# Patient Record
Sex: Male | Born: 1935 | Race: Black or African American | Hispanic: No | Marital: Married | Smoking: Never smoker
Health system: Southern US, Community
[De-identification: ages and names within clinical notes are randomized; demographics above are authoritative.]

## PROBLEM LIST (undated history)

## (undated) DIAGNOSIS — K5730 Diverticulosis of large intestine without perforation or abscess without bleeding: Secondary | ICD-10-CM

## (undated) DIAGNOSIS — I10 Essential (primary) hypertension: Secondary | ICD-10-CM

## (undated) DIAGNOSIS — D649 Anemia, unspecified: Secondary | ICD-10-CM

## (undated) DIAGNOSIS — N400 Enlarged prostate without lower urinary tract symptoms: Secondary | ICD-10-CM

## (undated) DIAGNOSIS — E785 Hyperlipidemia, unspecified: Secondary | ICD-10-CM

## (undated) DIAGNOSIS — D721 Eosinophilia: Secondary | ICD-10-CM

## (undated) DIAGNOSIS — H409 Unspecified glaucoma: Secondary | ICD-10-CM

## (undated) DIAGNOSIS — E119 Type 2 diabetes mellitus without complications: Secondary | ICD-10-CM

## (undated) DIAGNOSIS — N259 Disorder resulting from impaired renal tubular function, unspecified: Secondary | ICD-10-CM

## (undated) HISTORY — DX: Anemia, unspecified: D64.9

## (undated) HISTORY — DX: Hyperlipidemia, unspecified: E78.5

## (undated) HISTORY — DX: Diverticulosis of large intestine without perforation or abscess without bleeding: K57.30

## (undated) HISTORY — DX: Essential (primary) hypertension: I10

## (undated) HISTORY — DX: Benign prostatic hyperplasia without lower urinary tract symptoms: N40.0

## (undated) HISTORY — DX: Disorder resulting from impaired renal tubular function, unspecified: N25.9

## (undated) HISTORY — DX: Eosinophilia: D72.1

## (undated) HISTORY — DX: Unspecified glaucoma: H40.9

## (undated) HISTORY — DX: Type 2 diabetes mellitus without complications: E11.9

---

## 2001-07-26 ENCOUNTER — Emergency Department (HOSPITAL_COMMUNITY): Admission: EM | Admit: 2001-07-26 | Discharge: 2001-07-26 | Payer: Self-pay | Admitting: Emergency Medicine

## 2004-01-06 ENCOUNTER — Ambulatory Visit: Payer: Self-pay | Admitting: Internal Medicine

## 2004-02-09 ENCOUNTER — Ambulatory Visit: Payer: Self-pay | Admitting: Internal Medicine

## 2004-02-20 ENCOUNTER — Ambulatory Visit: Payer: Self-pay | Admitting: Internal Medicine

## 2004-03-03 ENCOUNTER — Ambulatory Visit: Payer: Self-pay | Admitting: Internal Medicine

## 2004-03-03 ENCOUNTER — Emergency Department (HOSPITAL_COMMUNITY): Admission: EM | Admit: 2004-03-03 | Discharge: 2004-03-04 | Payer: Self-pay | Admitting: Emergency Medicine

## 2004-04-07 ENCOUNTER — Ambulatory Visit: Payer: Self-pay | Admitting: Internal Medicine

## 2004-05-20 ENCOUNTER — Ambulatory Visit: Payer: Self-pay | Admitting: Internal Medicine

## 2004-06-18 ENCOUNTER — Ambulatory Visit: Payer: Self-pay | Admitting: Internal Medicine

## 2004-08-18 ENCOUNTER — Ambulatory Visit: Payer: Self-pay | Admitting: Internal Medicine

## 2004-09-20 ENCOUNTER — Ambulatory Visit: Payer: Self-pay | Admitting: Internal Medicine

## 2004-11-23 ENCOUNTER — Ambulatory Visit: Payer: Self-pay | Admitting: Internal Medicine

## 2006-03-01 ENCOUNTER — Encounter: Payer: Self-pay | Admitting: Internal Medicine

## 2006-03-02 ENCOUNTER — Ambulatory Visit: Payer: Self-pay | Admitting: Internal Medicine

## 2006-03-02 LAB — CONVERTED CEMR LAB
Basophils Relative: 1.3 % — ABNORMAL HIGH (ref 0.0–1.0)
Bilirubin, Direct: 0.2 mg/dL (ref 0.0–0.3)
Cholesterol: 211 mg/dL (ref 0–200)
Creatinine,U: 138.1 mg/dL
Eosinophil percent: 10.2 % — ABNORMAL HIGH (ref 0.0–5.0)
GFR calc non Af Amer: 79 mL/min
Glomerular Filtration Rate, Af Am: 95 mL/min/{1.73_m2}
Glucose, Bld: 228 mg/dL — ABNORMAL HIGH (ref 70–99)
Hemoglobin: 14 g/dL (ref 13.0–17.0)
Hgb A1c MFr Bld: 9.3 % — ABNORMAL HIGH (ref 4.6–6.0)
Lymphocytes Relative: 28.4 % (ref 12.0–46.0)
Microalb Creat Ratio: 25.3 mg/g (ref 0.0–30.0)
Monocytes Absolute: 0.3 10*3/uL (ref 0.2–0.7)
Neutro Abs: 2.8 10*3/uL (ref 1.4–7.7)
Potassium: 4.3 meq/L (ref 3.5–5.1)
Sodium: 137 meq/L (ref 135–145)
VLDL: 13 mg/dL (ref 0–40)
WBC: 5.2 10*3/uL (ref 4.5–10.5)

## 2006-04-25 ENCOUNTER — Ambulatory Visit: Payer: Self-pay | Admitting: Internal Medicine

## 2006-09-12 DIAGNOSIS — I10 Essential (primary) hypertension: Secondary | ICD-10-CM

## 2006-09-12 HISTORY — DX: Essential (primary) hypertension: I10

## 2006-11-01 ENCOUNTER — Ambulatory Visit: Payer: Self-pay | Admitting: Internal Medicine

## 2006-11-01 ENCOUNTER — Ambulatory Visit: Payer: Self-pay | Admitting: Family Medicine

## 2006-11-01 LAB — CONVERTED CEMR LAB
Albumin: 3.8 g/dL (ref 3.5–5.2)
Alkaline Phosphatase: 43 units/L (ref 39–117)
BUN: 14 mg/dL (ref 6–23)
Basophils Absolute: 0.1 10*3/uL (ref 0.0–0.1)
Cholesterol: 191 mg/dL (ref 0–200)
Creatinine, Ser: 1.2 mg/dL (ref 0.4–1.5)
Eosinophils Absolute: 0.9 10*3/uL — ABNORMAL HIGH (ref 0.0–0.6)
GFR calc Af Amer: 77 mL/min
GFR calc non Af Amer: 63 mL/min
HDL: 57.3 mg/dL (ref >39.0)
Hemoglobin: 11.7 g/dL — ABNORMAL LOW (ref 13.0–17.0)
LDL Cholesterol: 117 mg/dL — ABNORMAL HIGH (ref 0–99)
MCHC: 33.9 g/dL (ref 30.0–36.0)
Monocytes Absolute: 0.4 10*3/uL (ref 0.2–0.7)
Monocytes Relative: 7.5 % (ref 3.0–11.0)
Neutro Abs: 2.7 10*3/uL (ref 1.4–7.7)
PSA: 2.46 ng/mL (ref 0.10–4.00)
PTH: 27.9 pg/mL (ref 14.0–72.0)
Potassium: 4.4 meq/L (ref 3.5–5.1)
RDW: 12.8 % (ref 11.5–14.6)
TSH: 0.96 microintl units/mL (ref 0.35–5.50)
Total CHOL/HDL Ratio: 3.3

## 2006-11-02 ENCOUNTER — Encounter: Payer: Self-pay | Admitting: Internal Medicine

## 2006-11-02 DIAGNOSIS — N400 Enlarged prostate without lower urinary tract symptoms: Secondary | ICD-10-CM | POA: Insufficient documentation

## 2006-11-02 DIAGNOSIS — E785 Hyperlipidemia, unspecified: Secondary | ICD-10-CM | POA: Insufficient documentation

## 2006-11-02 HISTORY — DX: Benign prostatic hyperplasia without lower urinary tract symptoms: N40.0

## 2006-11-02 HISTORY — DX: Hyperlipidemia, unspecified: E78.5

## 2007-02-01 ENCOUNTER — Ambulatory Visit: Payer: Self-pay | Admitting: Internal Medicine

## 2007-02-01 DIAGNOSIS — K5730 Diverticulosis of large intestine without perforation or abscess without bleeding: Secondary | ICD-10-CM | POA: Insufficient documentation

## 2007-02-01 DIAGNOSIS — E119 Type 2 diabetes mellitus without complications: Secondary | ICD-10-CM

## 2007-02-01 HISTORY — DX: Diverticulosis of large intestine without perforation or abscess without bleeding: K57.30

## 2007-02-01 HISTORY — DX: Type 2 diabetes mellitus without complications: E11.9

## 2007-02-07 ENCOUNTER — Encounter: Payer: Self-pay | Admitting: Internal Medicine

## 2007-02-08 ENCOUNTER — Telehealth (INDEPENDENT_AMBULATORY_CARE_PROVIDER_SITE_OTHER): Payer: Self-pay | Admitting: *Deleted

## 2007-02-08 DIAGNOSIS — C8589 Other specified types of non-Hodgkin lymphoma, extranodal and solid organ sites: Secondary | ICD-10-CM

## 2007-02-12 ENCOUNTER — Encounter: Admission: RE | Admit: 2007-02-12 | Discharge: 2007-02-12 | Payer: Self-pay

## 2007-02-12 ENCOUNTER — Encounter: Payer: Self-pay | Admitting: Internal Medicine

## 2007-03-14 ENCOUNTER — Ambulatory Visit: Payer: Self-pay | Admitting: Internal Medicine

## 2007-03-14 DIAGNOSIS — N183 Chronic kidney disease, stage 3 (moderate): Secondary | ICD-10-CM

## 2007-03-14 DIAGNOSIS — D649 Anemia, unspecified: Secondary | ICD-10-CM

## 2007-03-14 DIAGNOSIS — N259 Disorder resulting from impaired renal tubular function, unspecified: Secondary | ICD-10-CM

## 2007-03-14 HISTORY — DX: Anemia, unspecified: D64.9

## 2007-03-14 HISTORY — DX: Disorder resulting from impaired renal tubular function, unspecified: N25.9

## 2007-03-15 LAB — CONVERTED CEMR LAB
Bacteria, UA: NONE SEEN
Cholesterol: 173 mg/dL (ref 0–200)
Creatinine, Ser: 1.6 mg/dL — ABNORMAL HIGH (ref 0.4–1.5)
Eosinophils Absolute: 1.4 10*3/uL — ABNORMAL HIGH (ref 0.0–0.6)
Folate: >20 ng/mL
GFR calc non Af Amer: 46 mL/min
Glucose, Bld: 177 mg/dL — ABNORMAL HIGH (ref 70–99)
HDL: 70 mg/dL (ref >39.0)
Hemoglobin: 12.9 g/dL — ABNORMAL LOW (ref 13.0–17.0)
Hgb A1c MFr Bld: 6.7 % — ABNORMAL HIGH (ref 4.6–6.0)
Iron: 49 ug/dL (ref 42–165)
Ketones, ur: NEGATIVE mg/dL
Lymphocytes Relative: 27.5 % (ref 12.0–46.0)
MCV: 82.5 fL (ref 78.0–100.0)
Monocytes Absolute: 0.4 10*3/uL (ref 0.2–0.7)
Monocytes Relative: 7.1 % (ref 3.0–11.0)
Neutro Abs: 2.6 10*3/uL (ref 1.4–7.7)
Nitrite: NEGATIVE
PSA: 3.12 ng/mL (ref 0.10–4.00)
Platelets: 196 10*3/uL (ref 150–400)
Potassium: 4.4 meq/L (ref 3.5–5.1)
RBC / HPF: NONE SEEN (ref <3)
Sodium: 138 meq/L (ref 135–145)
Specific Gravity, Urine: 1.019 (ref 1.005–1.03)
Triglycerides: 52 mg/dL (ref 0–149)
Urobilinogen, UA: 0.2 (ref 0.0–1.0)
Vitamin B-12: 899 pg/mL (ref 211–911)
WBC, UA: NONE SEEN cells/hpf (ref <3)
pH: 6 (ref 5.0–8.0)

## 2007-03-16 ENCOUNTER — Encounter: Admission: RE | Admit: 2007-03-16 | Discharge: 2007-03-16 | Payer: Self-pay | Admitting: Internal Medicine

## 2007-03-16 ENCOUNTER — Encounter: Payer: Self-pay | Admitting: Internal Medicine

## 2007-03-19 ENCOUNTER — Telehealth (INDEPENDENT_AMBULATORY_CARE_PROVIDER_SITE_OTHER): Payer: Self-pay | Admitting: *Deleted

## 2007-03-20 ENCOUNTER — Encounter: Payer: Self-pay | Admitting: Internal Medicine

## 2007-03-21 ENCOUNTER — Telehealth: Payer: Self-pay | Admitting: Internal Medicine

## 2007-03-22 ENCOUNTER — Encounter (INDEPENDENT_AMBULATORY_CARE_PROVIDER_SITE_OTHER): Payer: Self-pay | Admitting: *Deleted

## 2007-04-17 ENCOUNTER — Encounter: Payer: Self-pay | Admitting: Internal Medicine

## 2007-05-01 ENCOUNTER — Encounter: Payer: Self-pay | Admitting: Internal Medicine

## 2007-05-25 ENCOUNTER — Encounter: Payer: Self-pay | Admitting: Internal Medicine

## 2007-06-11 ENCOUNTER — Encounter: Payer: Self-pay | Admitting: Internal Medicine

## 2007-06-22 ENCOUNTER — Encounter: Payer: Self-pay | Admitting: Internal Medicine

## 2007-07-31 ENCOUNTER — Encounter: Payer: Self-pay | Admitting: Internal Medicine

## 2007-10-30 ENCOUNTER — Encounter: Payer: Self-pay | Admitting: Internal Medicine

## 2008-04-10 ENCOUNTER — Ambulatory Visit: Payer: Self-pay | Admitting: Internal Medicine

## 2008-04-10 DIAGNOSIS — R079 Chest pain, unspecified: Secondary | ICD-10-CM | POA: Insufficient documentation

## 2008-04-10 DIAGNOSIS — R5383 Other fatigue: Secondary | ICD-10-CM

## 2008-04-10 DIAGNOSIS — R5381 Other malaise: Secondary | ICD-10-CM | POA: Insufficient documentation

## 2008-12-31 ENCOUNTER — Encounter (INDEPENDENT_AMBULATORY_CARE_PROVIDER_SITE_OTHER): Payer: Self-pay | Admitting: *Deleted

## 2009-11-05 ENCOUNTER — Encounter: Payer: Self-pay | Admitting: Internal Medicine

## 2009-11-05 ENCOUNTER — Ambulatory Visit: Payer: Self-pay | Admitting: Internal Medicine

## 2009-11-05 DIAGNOSIS — D721 Eosinophilia, unspecified: Secondary | ICD-10-CM

## 2009-11-05 DIAGNOSIS — H409 Unspecified glaucoma: Secondary | ICD-10-CM | POA: Insufficient documentation

## 2009-11-05 DIAGNOSIS — R972 Elevated prostate specific antigen [PSA]: Secondary | ICD-10-CM | POA: Insufficient documentation

## 2009-11-05 DIAGNOSIS — M25569 Pain in unspecified knee: Secondary | ICD-10-CM

## 2009-11-05 HISTORY — DX: Eosinophilia, unspecified: D72.10

## 2009-11-05 HISTORY — DX: Unspecified glaucoma: H40.9

## 2009-11-05 LAB — CONVERTED CEMR LAB
ALT: 15 units/L (ref 0–53)
AST: 17 units/L (ref 0–37)
Albumin: 3.9 g/dL (ref 3.5–5.2)
Alkaline Phosphatase: 58 units/L (ref 39–117)
BUN: 17 mg/dL (ref 6–23)
Basophils Absolute: 0 10*3/uL (ref 0.0–0.1)
Basophils Relative: 0.1 % (ref 0.0–3.0)
Bilirubin Urine: NEGATIVE
Bilirubin, Direct: 0.1 mg/dL (ref 0.0–0.3)
CO2: 27 meq/L (ref 19–32)
Calcium: 9.6 mg/dL (ref 8.4–10.5)
Chloride: 109 meq/L (ref 96–112)
Cholesterol: 200 mg/dL (ref 0–200)
Creatinine, Ser: 1.3 mg/dL (ref 0.4–1.5)
Creatinine,U: 172.2 mg/dL
Eosinophils Absolute: 3.7 10*3/uL — ABNORMAL HIGH (ref 0.0–0.7)
Eosinophils Relative: 40.5 % — ABNORMAL HIGH (ref 0.0–5.0)
GFR calc non Af Amer: 68.74 mL/min (ref >60)
Glucose, Bld: 140 mg/dL — ABNORMAL HIGH (ref 70–99)
HCT: 37.7 % — ABNORMAL LOW (ref 39.0–52.0)
HDL: 52.8 mg/dL (ref >39.00)
Hemoglobin, Urine: NEGATIVE
Hemoglobin: 12.6 g/dL — ABNORMAL LOW (ref 13.0–17.0)
Hgb A1c MFr Bld: 6.9 % — ABNORMAL HIGH (ref 4.6–6.5)
Ketones, ur: NEGATIVE mg/dL
LDL Cholesterol: 133 mg/dL — ABNORMAL HIGH (ref 0–99)
Leukocytes, UA: NEGATIVE
Lymphocytes Relative: 23 % (ref 12.0–46.0)
Lymphs Abs: 2.1 10*3/uL (ref 0.7–4.0)
MCHC: 33.5 g/dL (ref 30.0–36.0)
MCV: 81.3 fL (ref 78.0–100.0)
Microalb Creat Ratio: 3.2 mg/g (ref 0.0–30.0)
Microalb, Ur: 5.5 mg/dL — ABNORMAL HIGH (ref 0.0–1.9)
Monocytes Absolute: 0.6 10*3/uL (ref 0.1–1.0)
Monocytes Relative: 6.2 % (ref 3.0–12.0)
Neutro Abs: 2.8 10*3/uL (ref 1.4–7.7)
Neutrophils Relative %: 30.2 % — ABNORMAL LOW (ref 43.0–77.0)
Nitrite: NEGATIVE
PSA: 3.75 ng/mL (ref 0.10–4.00)
Platelets: 162 10*3/uL (ref 150.0–400.0)
Potassium: 4.7 meq/L (ref 3.5–5.1)
RBC: 4.64 M/uL (ref 4.22–5.81)
RDW: 14.3 % (ref 11.5–14.6)
Sed Rate: 34 mm/hr — ABNORMAL HIGH (ref 0–22)
Sodium: 141 meq/L (ref 135–145)
Specific Gravity, Urine: 1.02 (ref 1.000–1.030)
TSH: 2.35 microintl units/mL (ref 0.35–5.50)
Total Bilirubin: 0.6 mg/dL (ref 0.3–1.2)
Total CHOL/HDL Ratio: 4
Total Protein, Urine: NEGATIVE mg/dL
Total Protein: 7.1 g/dL (ref 6.0–8.3)
Triglycerides: 73 mg/dL (ref 0.0–149.0)
Urine Glucose: NEGATIVE mg/dL
Urobilinogen, UA: 0.2 (ref 0.0–1.0)
VLDL: 14.6 mg/dL (ref 0.0–40.0)
WBC: 9.2 10*3/uL (ref 4.5–10.5)
pH: 6 (ref 5.0–8.0)

## 2009-11-09 ENCOUNTER — Ambulatory Visit: Payer: Self-pay | Admitting: Hematology and Oncology

## 2009-11-17 ENCOUNTER — Encounter: Payer: Self-pay | Admitting: Internal Medicine

## 2009-11-17 LAB — MORPHOLOGY

## 2009-11-17 LAB — COMPREHENSIVE METABOLIC PANEL
AST: 16 U/L (ref 0–37)
Alkaline Phosphatase: 60 U/L (ref 39–117)
BUN: 14 mg/dL (ref 6–23)
Creatinine, Ser: 1.32 mg/dL (ref 0.40–1.50)

## 2009-11-17 LAB — CBC WITH DIFFERENTIAL/PLATELET
Eosinophils Absolute: 1.8 10*3/uL — ABNORMAL HIGH (ref 0.0–0.5)
MONO#: 0.4 10*3/uL (ref 0.1–0.9)
NEUT#: 2.1 10*3/uL (ref 1.5–6.5)
Platelets: 206 10*3/uL (ref 140–400)
RBC: 4.7 10*6/uL (ref 4.20–5.82)
RDW: 14 % (ref 11.0–14.6)
WBC: 6.3 10*3/uL (ref 4.0–10.3)

## 2010-03-21 LAB — CONVERTED CEMR LAB
ALT: 22 units/L (ref 0–53)
AST: 25 units/L (ref 0–37)
Albumin: 4.3 g/dL (ref 3.5–5.2)
Alkaline Phosphatase: 54 units/L (ref 39–117)
BUN: 14 mg/dL (ref 6–23)
Bilirubin Urine: NEGATIVE
CO2: 29 meq/L (ref 19–32)
Chloride: 105 meq/L (ref 96–112)
Creatinine, Ser: 1.7 mg/dL — ABNORMAL HIGH (ref 0.4–1.5)
Eosinophils Relative: 15.2 % — ABNORMAL HIGH (ref 0.0–5.0)
GFR calc non Af Amer: 42 mL/min
HCT: 38.9 % — ABNORMAL LOW (ref 39.0–52.0)
HDL: 67.5 mg/dL (ref >39.0)
Hemoglobin: 13.1 g/dL (ref 13.0–17.0)
Hgb A1c MFr Bld: 7.6 % — ABNORMAL HIGH (ref 4.6–6.0)
Iron: 73 ug/dL (ref 42–165)
Leukocytes, UA: NEGATIVE
Lymphocytes Relative: 27.7 % (ref 12.0–46.0)
Monocytes Absolute: 0.5 10*3/uL (ref 0.1–1.0)
Monocytes Relative: 6.7 % (ref 3.0–12.0)
Neutro Abs: 3.9 10*3/uL (ref 1.4–7.7)
Nitrite: NEGATIVE
PSA: 4.47 ng/mL — ABNORMAL HIGH (ref 0.10–4.00)
Potassium: 4.2 meq/L (ref 3.5–5.1)
RBC: 4.72 M/uL (ref 4.22–5.81)
RDW: 13.8 % (ref 11.5–14.6)
Specific Gravity, Urine: 1.025 (ref 1.000–1.03)
TSH: 1.52 microintl units/mL (ref 0.35–5.50)
Total Bilirubin: 1.7 mg/dL — ABNORMAL HIGH (ref 0.3–1.2)
Total CHOL/HDL Ratio: 2.9
Transferrin: 237 mg/dL (ref >=212.0)
Vitamin B-12: 682 pg/mL (ref 211–911)
WBC: 7.9 10*3/uL (ref 4.5–10.5)
pH: 5.5 (ref 5.0–8.0)

## 2010-03-25 NOTE — Assessment & Plan Note (Signed)
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  History of Present Illness: here for f/u and wellness ;  has  been in [place] for 18 mo approx, now here for total 3 wks only;  Pt denies CP, worsening sob, doe, wheezing, orthopnea, pnd, worsening LE edema, palps, dizziness or syncope  Pt denies new neuro symptoms such as headache, facial or extremity weakness  Pt denies polydipsia, polyuria, or low sugar symptoms such as shakiness improved with eating.  Overall good compliance with meds, trying to follow low chol, DM diet, wt stable, little excercise however CBG's were in the low 100';s - less than 110 often on his current med regimen.  Does c/o gradually increasing bilat knee pain , right much more than left; limps to walk often;  no falls;  no effusion; pain located anterior and assoc with click and ? catch;  pain on the right worse to stand and walk,  limited on standing to one hour, can walk for everyay activities , but no  longer walks his 4 miles several times per wk.    Here for wellness Diet: Heart Healthy or DM if diabetic Physical Activities: Sedentary Depression/mood screen: Negative Hearing: Intact bilateral to mild loss only Visual Acuity: Grossly normal, gets exam yearly - last exam 2 days ago in [place], s/p cataract ADL's: Capable  Fall Risk: None Home Safety: Good Cognitive Impairment:  Gen appearance, affect, speech, memory, attention & motor  skills grossly intact End-of-Life Planning: Advance directive - Full code/I agree   Preventive Screening-Counseling & Management      Drug Use:  no.    Problems Prior to Update: 1)  Eosinophilia  (ICD-288.3) 2)  Fatigue  (ICD-780.79) 3)  Knee Pain, Right  (ICD-719.46) 4)  Glaucoma  (ICD-365.9) 5)  Psa, Increased  (ICD-790.93) 6)  Chest Pain  (ICD-786.50) 7)  Special Screening Malig Neoplasms Other Sites  (ICD-V76.49) 8)  Fatigue  (ICD-780.79) 9)  Preventive Health Care  (ICD-V70.0) 10)  Special Screening Malignant Neoplasm of Prostate  (ICD-V76.44) 11)  Renal Insufficiency  (ICD-588.9) 12)  Anemia-nos  (ICD-285.9) 13)  T Cell Cutaneous Lymphoma  () 14)  Lymphoma  (ICD-202.80) 15)  Cutaneous T Cell Lymphoma  () 16)  Diverticulosis, Colon  (ICD-562.10) 17)  Diabetes Mellitus, Type II  (ICD-250.00) 18)  Benign Prostatic Hypertrophy  (ICD-600.00) 19)  Hyperlipidemia  (ICD-272.4) 20)  Hypertension  (ICD-401.9)  Medications Prior to Update: 1)  [place] Lancet Super Thin 30g  Misc (Lancets) .... Use 1 Stick As Directed Bid 2)  Glucophage 500 Mg Tabs (Metformin Hcl) .... 2 By Mouth Two Times A Day 3)  Lovastatin 40 Mg  Tabs (Lovastatin) .... 2 By Mouth Once Daily 4)  Onetouch Ultra  Test   Strp (Glucose Blood) .... Use Asd Bid 5)  Azor 5-20 Mg  Tabs (Amlodipine-Olmesartan) .Marland Kitchen.. 1 By Mouth Qd 6)  Hydrochlorothiazide 25 Mg  Tabs (Hydrochlorothiazide) .... Take 1/2 By Mouth Qd 7)  Januvia 100 Mg  Tabs (Sitagliptin Phosphate) .Marland Kitchen.. 1 By Mouth Qd 8)  Onetouch Ultrasoft Lancets   Misc (Lancets) .... Use 1 Lancet Qd  Current Medications (verified): 1)  Glucophage 500 Mg Tabs (Metformin Hcl) .Marland Kitchen.. 1 By Mouth Once Daily 2)  Lipitor 10 Mg Tabs (Atorvastatin Calcium) .Marland Kitchen.. 1 By Mouth Once Daily 3)  Onetouch Ultra Test   Strp (Glucose Blood) .... Use Asd Bid 4)  Onetouch Ultrasoft Lancets   Misc (Lancets) .... Use 1 Lancet Qd 5)  Amlodipine Besylate 10 Mg Tabs (Amlodipine Besylate) .Marland Kitchen.. 1 By Mouth  Once Daily 6)  Daonil (Oral Sulfonylurea From [place]) .Marland Kitchen.. 1 By Mouth Once Daily 7)  Cosopt 22.3-6.8 Mg/ml Soln (Dorzolamide Hcl-Timolol Mal) .Marland Kitchen.. 1 Gtt Two Times A Day Both Eyes 8)  Tramadol Hcl 50 Mg Tabs (Tramadol Hcl) .Marland Kitchen.. 1-2 By Mouth Q 6 Hrs As Needed  Allergies (verified): 1)  ! 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 Denies surgical history  Social History: Reviewed history from 04/10/2008 and no changes required. 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          all otherwise negative per pt -   has ongoing mild fatigue - plans to work for 2 more yrs, then retire permanently in [place]  Physical Exam  General:  alert and overweight-appearing.   Head:  normocephalic and atraumatic.   Eyes:  No corneal or conjunctival inflammation noted. EOMI. Perrla. Ears:  External ear exam shows no significant lesions or deformities.  Otoscopic examination reveals clear canals, tympanic membranes are intact bilaterally without bulging, retraction, inflammation or discharge. Hearing is grossly normal bilaterally. Nose:  External nasal examination shows no deformity or inflammation. Nasal mucosa are pink and moist without lesions or exudates. Mouth:  Oral mucosa and oropharynx without lesions or exudates.  Teeth in good repair. Neck:  No deformities, masses, or tenderness noted. Lungs:  Normal respiratory effort, chest expands symmetrically. Lungs are  clear to auscultation, no crackles or wheezes. Heart:  normal rate and regular rhythm.   Abdomen:  soft, non-tender, and normal bowel sounds.   Msk:  right knee without effusion but with signficiant crepitus;  nontender and FROM Extremities:  no edema, no ulcers  Neurologic:  cranial nerves II-XII intact and strength normal in all extremities.  cognitive intact to orientation, recall, naming, and repetition  Skin:  color normal and no rashes.   Psych:  not anxious appearing and not depressed appearing.     Impression & Recommendations:  Problem # 1:  Preventive Health Care (ICD-V70.0) Overall doing well, age appropriate education and counseling updated and referral for appropriate preventive services done unless declined, immunizations up to date or declined, diet counseling done if overweight, urged to quit smoking if smokes , most recent labs reviewed and current ordered if appropriate, ecg reviewed or declined (interpretation per ECG scanned in the EMR if done); information regarding Medicare Prevention requirements  given if appropriate; speciality referrals updated as appropriate  Orders: EKG w/ Interpretation (93000) Medicare -1st Annual Wellness Visit [phone])  Problem # 2:  KNEE PAIN, RIGHT (ICD-719.46)  exam c/w worsening DJD  = no likely midl to mod; ok for tramadol as needed, or tylenol for lesser pain;  declines ortho eval at this time  His updated medication list for this problem includes:    Tramadol Hcl 50 Mg Tabs (Tramadol hcl) .Marland Kitchen... 1-2 by mouth q 6 hrs as needed  Orders: Prescription Created Electronically [phone])  Problem # 3:  PSA, INCREASED (ICD-790.93)  to re-check today - asympt  Orders: TLB-PSA (Prostate Specific Antigen) (84153-PSA)  Problem # 4:  DIABETES MELLITUS, TYPE II (ICD-250.00) Assessment: Improved  The following medications were removed from the medication list:    Azor 5-20 Mg Tabs (Amlodipine-olmesartan) .Marland Kitchen... 1 by mouth qd    Januvia 100 Mg Tabs (Sitagliptin phosphate) .Marland Kitchen... 1 by mouth qd His updated medication list for this problem includes:    Glucophage 500 Mg Tabs (Metformin hcl) .Marland Kitchen... 1 by mouth once daily  Orders: TLB-Lipid Panel (80061-LIPID) TLB-Microalbumin/Creat Ratio, Urine (82043-MALB) TLB-A1C / Hgb A1C (Glycohemoglobin) (83036-A1C)  Labs Reviewed: Creat: 1.7 (04/10/2008)    Reviewed HgBA1c results: 7.6 (04/10/2008)  6.7 (03/14/2007) stable overall by hx and exam, ok to continue meds/tx as is   Problem # 5:  HYPERTENSION (ICD-401.9)  The following medications were removed from the medication list:    Azor 5-20 Mg Tabs (Amlodipine-olmesartan) .Marland Kitchen... 1 by mouth qd    Hydrochlorothiazide 25 Mg Tabs (Hydrochlorothiazide) .Marland Kitchen... Take 1/2 by mouth qd His updated medication list for this problem includes:    Amlodipine Besylate 10 Mg Tabs (Amlodipine besylate) .Marland Kitchen... 1 by mouth once daily  BP today: 148/90 Prior BP: 144/80 (04/10/2008)  Labs Reviewed: K+: 4.2 (04/10/2008) Creat: : 1.7 (04/10/2008)   Chol: 199 (04/10/2008)   HDL: 67.5  (04/10/2008)   LDL: 118 (04/10/2008)   TG: 68 (04/10/2008) mild elev, but declines med change at htis time , as this is primarily followed in [place] and plans to return there soon  Problem # 6:  HYPERLIPIDEMIA (ICD-272.4)  His updated medication list for this problem includes:    Lipitor 10 Mg Tabs (Atorvastatin calcium) .Marland Kitchen... 1 by mouth once daily  Labs Reviewed: SGOT: 25 (04/10/2008)   SGPT: 22 (04/10/2008)   HDL:67.5 (04/10/2008), 70.0 (03/14/2007)  LDL:118 (04/10/2008), 93 (09/81/1914)  Chol:199 (04/10/2008), 173 (03/14/2007)  Trig:68 (04/10/2008), 52 (03/14/2007) stable overall by hx and exam, ok to continue meds/tx  as is   Problem # 7:  FATIGUE (ICD-780.79)  exam benign, to check labs below; follow with expectant management ; ecg reviewed with pt  Orders: TLB-BMP (Basic Metabolic Panel-BMET) (80048-METABOL) TLB-CBC Platelet - w/Differential (85025-CBCD) TLB-Hepatic/Liver Function Pnl (80076-HEPATIC) TLB-TSH (Thyroid Stimulating Hormone) (84443-TSH) TLB-Sedimentation Rate (ESR) (85652-ESR) TLB-Udip ONLY (81003-UDIP)  Complete Medication List: 1)  Glucophage 500 Mg Tabs (Metformin hcl) .Marland Kitchen.. 1 by mouth once daily 2)  Lipitor 10 Mg Tabs (Atorvastatin calcium) .Marland Kitchen.. 1 by mouth once daily 3)  Onetouch Ultra Test Strp (Glucose blood) .... Use asd bid 4)  Onetouch Ultrasoft Lancets Misc (Lancets) .... Use 1 lancet qd 5)  Amlodipine Besylate 10 Mg Tabs (Amlodipine besylate) .Marland Kitchen.. 1 by mouth once daily 6)  Daonil (oral Sulfonylurea From [place])  .Marland Kitchen.. 1 by mouth once daily 7)  Cosopt 22.3-6.8 Mg/ml Soln (Dorzolamide hcl-timolol mal) .Marland Kitchen.. 1 gtt two times a day both eyes 8)  Tramadol Hcl 50 Mg Tabs (Tramadol hcl) .Marland Kitchen.. 1-2 by mouth q 6 hrs as needed  Other Orders: Flu Vaccine 63yrs + MEDICARE PATIENTS (Z6109) Administration Flu vaccine - MCR (U0454)  Patient Instructions: 1)  you had the flu shot today 2)  you can try the OTC tylenol 8 hr - 2 pills every 8 hrs as needed 3)  Please  take all new medications as prescribed - tramadol 4)  Please go to the Lab in the basement for your blood and/or urine tests today 5)  Continue all previous medications as before this visit  6)  Please call the number on the [facility] Card for results of your testing  7)  Please schedule a follow-up appointment in 6 months or as you can Prescriptions: TRAMADOL HCL 50 MG TABS (TRAMADOL HCL) 1-2 by mouth q 6 hrs as needed  #240 x 3   Entered and Authorized by:   Corwin Levins MD   Signed by:   Corwin Levins MD on 11/05/2009   Method used:   Print then Give to Patient   RxID:   [id number]      Flu Vaccine Consent Questions     Do you have a history of severe allergic reactions to this vaccine? no    Any prior history of allergic reactions to egg and/or gelatin? no    Do you have a sensitivity to the preservative Thimersol? no    Do you have a past history of Guillan-Barre Syndrome? no    Do you currently have an acute febrile illness? no    Have you ever had a severe reaction to latex? no    Vaccine information given and explained to patient? yes    Are you currently pregnant? no    Lot Number:[id number]   Exp Date:08/21/2010   Site Given  Left Deltoid IMlu

## 2010-03-25 NOTE — Letter (Signed)
Summary: [facility]  [facility]   Imported By: Sherian Rein 12/01/2009 10:13:26  _____________________________________________________________________  External Attachment:    Type:   Image     Comment:   External Document

## 2011-10-10 DIAGNOSIS — H409 Unspecified glaucoma: Secondary | ICD-10-CM | POA: Diagnosis not present

## 2011-10-10 DIAGNOSIS — H4011X0 Primary open-angle glaucoma, stage unspecified: Secondary | ICD-10-CM | POA: Diagnosis not present

## 2011-10-10 DIAGNOSIS — Z961 Presence of intraocular lens: Secondary | ICD-10-CM | POA: Diagnosis not present

## 2011-10-10 DIAGNOSIS — E119 Type 2 diabetes mellitus without complications: Secondary | ICD-10-CM | POA: Diagnosis not present

## 2011-10-11 ENCOUNTER — Encounter: Payer: Self-pay | Admitting: Internal Medicine

## 2011-10-11 ENCOUNTER — Other Ambulatory Visit (INDEPENDENT_AMBULATORY_CARE_PROVIDER_SITE_OTHER): Payer: Medicare Other

## 2011-10-11 ENCOUNTER — Ambulatory Visit (INDEPENDENT_AMBULATORY_CARE_PROVIDER_SITE_OTHER): Payer: Medicare Other | Admitting: Internal Medicine

## 2011-10-11 VITALS — BP 144/76 | HR 70 | Temp 97.0°F | Ht 70.0 in | Wt 171.4 lb

## 2011-10-11 DIAGNOSIS — E119 Type 2 diabetes mellitus without complications: Secondary | ICD-10-CM

## 2011-10-11 DIAGNOSIS — I10 Essential (primary) hypertension: Secondary | ICD-10-CM

## 2011-10-11 DIAGNOSIS — E785 Hyperlipidemia, unspecified: Secondary | ICD-10-CM

## 2011-10-11 DIAGNOSIS — N320 Bladder-neck obstruction: Secondary | ICD-10-CM | POA: Diagnosis not present

## 2011-10-11 DIAGNOSIS — M25569 Pain in unspecified knee: Secondary | ICD-10-CM

## 2011-10-11 DIAGNOSIS — Z0000 Encounter for general adult medical examination without abnormal findings: Secondary | ICD-10-CM

## 2011-10-11 LAB — URINALYSIS, ROUTINE W REFLEX MICROSCOPIC
Bilirubin Urine: NEGATIVE
Ketones, ur: NEGATIVE
Leukocytes, UA: NEGATIVE
Urine Glucose: NEGATIVE
Urobilinogen, UA: 0.2 (ref 0.0–1.0)

## 2011-10-11 LAB — BASIC METABOLIC PANEL
BUN: 20 mg/dL (ref 6–23)
Chloride: 106 mEq/L (ref 96–112)
Potassium: 4 mEq/L (ref 3.5–5.1)
Sodium: 140 mEq/L (ref 135–145)

## 2011-10-11 LAB — PSA: PSA: 4.46 ng/mL — ABNORMAL HIGH (ref 0.10–4.00)

## 2011-10-11 LAB — CBC WITH DIFFERENTIAL/PLATELET
Eosinophils Absolute: 1.9 10*3/uL — ABNORMAL HIGH (ref 0.0–0.7)
HCT: 38.8 % — ABNORMAL LOW (ref 39.0–52.0)
Lymphs Abs: 2 10*3/uL (ref 0.7–4.0)
MCHC: 32.7 g/dL (ref 30.0–36.0)
MCV: 81.1 fl (ref 78.0–100.0)
Monocytes Absolute: 0.6 10*3/uL (ref 0.1–1.0)
Neutrophils Relative %: 42 % — ABNORMAL LOW (ref 43.0–77.0)
Platelets: 189 10*3/uL (ref 150.0–400.0)
RDW: 14.6 % (ref 11.5–14.6)

## 2011-10-11 LAB — MICROALBUMIN / CREATININE URINE RATIO
Creatinine,U: 124 mg/dL
Microalb, Ur: 4.9 mg/dL — ABNORMAL HIGH (ref 0.0–1.9)

## 2011-10-11 LAB — LIPID PANEL
Cholesterol: 156 mg/dL (ref 0–200)
Total CHOL/HDL Ratio: 2
Triglycerides: 81 mg/dL (ref 0.0–149.0)

## 2011-10-11 LAB — HEPATIC FUNCTION PANEL
ALT: 23 U/L (ref 0–53)
AST: 25 U/L (ref 0–37)
Alkaline Phosphatase: 55 U/L (ref 39–117)
Bilirubin, Direct: 0.2 mg/dL (ref 0.0–0.3)
Total Bilirubin: 1.2 mg/dL (ref 0.3–1.2)

## 2011-10-11 LAB — TSH: TSH: 1.82 u[IU]/mL (ref 0.35–5.50)

## 2011-10-11 MED ORDER — METFORMIN HCL 500 MG PO TABS: 500.0000 mg | ORAL_TABLET | Freq: Three times a day (TID) | ORAL | Status: DC

## 2011-10-11 MED ORDER — ATORVASTATIN CALCIUM 10 MG PO TABS: 10.0000 mg | ORAL_TABLET | Freq: Every day | ORAL | Status: DC

## 2011-10-11 MED ORDER — TRAMADOL HCL 50 MG PO TABS: ORAL_TABLET | ORAL | Status: DC

## 2011-10-11 MED ORDER — AMILORIDE-HYDROCHLOROTHIAZIDE 5-50 MG PO TABS: ORAL_TABLET | ORAL | Status: DC

## 2011-10-11 MED ORDER — AMLODIPINE BESYLATE 10 MG PO TABS: 10.0000 mg | ORAL_TABLET | Freq: Every day | ORAL | Status: DC

## 2011-10-11 MED ORDER — GLUCOSE BLOOD VI STRP: ORAL_STRIP | Status: AC

## 2011-10-11 MED ORDER — ONETOUCH LANCETS MISC: 1.0000 "application " | Freq: Every day | Status: AC

## 2011-10-11 NOTE — Patient Instructions (Addendum)
Continue all other medications as before, including the pain medication You are given the refills today, including the supply refills (lancets and strips) You are given the new glucometer today Please go to LAB in the Basement for the blood and/or urine tests to be done today You will be contacted by phone if any changes need to be made immediately.  Otherwise, you will receive a letter about your results with an explanation. Please keep your appointments with your specialists as you have planned - opthomology Please return in 6 months, or sooner if needed

## 2011-10-11 NOTE — Assessment & Plan Note (Signed)
Also for PSA as he is due, o.w asympt 

## 2011-10-11 NOTE — Progress Notes (Signed)
Subjective:    Patient ID: Raymond Dean, male    DOB: 27-Oct-1933, 78 y.o.   MRN: [id number]  HPI  Here to f/u after lost to f/u for almost 2 yrs, has been teaching in [place]; overall doing ok,  Pt denies chest pain, increased sob or doe, wheezing, orthopnea, PND, increased LE swelling, palpitations, dizziness or syncope.  Pt denies new neurological symptoms such as new headache, or facial or extremity weakness or numbness   Pt denies polydipsia, polyuria, or low sugar symptoms such as weakness or confusion improved with po intake.  Pt states overall good compliance with meds, trying to follow lower cholesterol, diabetic diet, wt overall stable but little exercise however  Has been in [place] for over a yr, just back to the [place] 3 days ago.  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  Does not want colon screening at this time Past Medical History  Diagnosis Date  . DIABETES MELLITUS, TYPE II 02/01/2007    Qualifier: Diagnosis of  By: Jonny Ruiz MD, Len Blalock   . HYPERLIPIDEMIA 11/02/2006    Qualifier: Diagnosis of  By: Jonny Ruiz MD, Len Blalock   . HYPERTENSION 09/12/2006    Qualifier: Diagnosis of  By: Maris Berger   . ANEMIA-NOS 03/14/2007    Qualifier: Diagnosis of  By: Jonny Ruiz MD, Len Blalock   . Eosinophilia 11/05/2009    Qualifier: Diagnosis of  By: Jonny Ruiz MD, Len Blalock   . GLAUCOMA 11/05/2009    Qualifier: Diagnosis of  By: Jonny Ruiz MD, Len Blalock   . DIVERTICULOSIS, COLON 02/01/2007    Qualifier: Diagnosis of  By: Jonny Ruiz MD, Len Blalock   . RENAL INSUFFICIENCY 03/14/2007    Qualifier: Diagnosis of  By: Jonny Ruiz MD, Len Blalock   . BENIGN PROSTATIC HYPERTROPHY  11/02/2006    Qualifier: Diagnosis of  By: Jonny Ruiz MD, Len Blalock   . Cutaneous Lymphoma 02/08/2007    T-cell    History reviewed. No pertinent past surgical history.  reports that he has never smoked. He has never used smokeless tobacco. He reports that he drinks alcohol. He reports that he does not use illicit drugs. family history includes Diabetes in his mother; Hypertension in his father and sister; and Stroke in his brother and mother. Allergies  Allergen Reactions  . Ace Inhibitors    Current Outpatient Prescriptions on File Prior to Visit  Medication Sig Dispense Refill  . amLODipine (NORVASC) 10 MG tablet Take 1 tablet (10 mg total) by mouth daily.  90 tablet  3  . atorvastatin (LIPITOR) 10 MG tablet Take 1 tablet (10 mg total) by mouth daily.  90 tablet  3  . metFORMIN (GLUCOPHAGE) 500 MG tablet Take 1 tablet (500 mg total) by mouth 3 (three) times daily.  270 tablet  3   Review of Systems Review of Systems  Constitutional: Negative for diaphoresis and unexpected weight change.  HENT: Negative for drooling and tinnitus.   Eyes: Negative for photophobia and visual disturbance.  Respiratory: Negative for choking and stridor.   Gastrointestinal: Negative for vomiting and blood in stool.  Genitourinary: Negative for hematuria  and decreased urine volume.  Musculoskeletal: Negative for gait problem.  Skin: Negative for color change and wound.  Neurological: Negative for tremors and numbness.  Psychiatric/Behavioral: Negative for decreased concentration. The patient is not hyperactive.       Objective:   Physical Exam BP 144/76  Pulse 70  Temp 97 F (36.1 C) (Oral)  Ht 5\' 10"  (1.778 m)  Wt 171 lb 6 oz (77.735 kg)  BMI 24.59 kg/m2  SpO2 97% Physical Exam  VS noted Constitutional: Pt appears well-developed and well-nourished.  HENT: Head: Normocephalic.  Right Ear: External ear normal.  Left Ear: External ear normal.  Eyes: Conjunctivae and EOM are normal. Pupils are equal,  round, and reactive to light.  Neck: Normal range of motion. Neck supple.  Cardiovascular: Normal rate and regular rhythm.   Pulmonary/Chest: Effort normal and breath sounds normal.  Abd:  Soft, NT, non-distended, + BS Neurological: Pt is alert. No cranial nerve deficit. motor/dtr/gait intact Skin: Skin is warm. No erythema.  Psychiatric: Pt behavior is normal. Thought content normal.  Right knee with marked crepitus, decreased ROM, NT, no effusion    Assessment & Plan:

## 2011-10-11 NOTE — Assessment & Plan Note (Signed)
stable overall by hx and exam, most recent data reviewed with pt, and pt to continue medical treatment as before Lab Results  Component Value Date   HGBA1C 6.9* 10/11/2011

## 2011-10-11 NOTE — Assessment & Plan Note (Signed)
stable overall by hx and exam, most recent data reviewed with pt, and pt to continue medical treatment as before Lab Results  Component Value Date   LDLCALC 77 10/11/2011

## 2011-10-11 NOTE — Assessment & Plan Note (Signed)
Not charged,  Pt declines screening colonoscopy

## 2011-10-11 NOTE — Assessment & Plan Note (Signed)
Ongoing right knee DJD - for tramadol prn, conisder ortho but declines at this time

## 2011-10-11 NOTE — Assessment & Plan Note (Addendum)
stable overall by hx and exam, most recent data reviewed with pt, and pt to continue medical treatment as before, I think needs further med but he declines BP Readings from Last 3 Encounters:  10/11/11 144/76  11/05/09 148/90  04/10/08 144/80

## 2011-10-14 DIAGNOSIS — C8409 Mycosis fungoides, extranodal and solid organ sites: Secondary | ICD-10-CM | POA: Diagnosis not present

## 2011-10-17 DIAGNOSIS — H10509 Unspecified blepharoconjunctivitis, unspecified eye: Secondary | ICD-10-CM | POA: Diagnosis not present

## 2011-10-28 ENCOUNTER — Encounter: Payer: Self-pay | Admitting: Gastroenterology

## 2011-12-06 ENCOUNTER — Encounter: Payer: Self-pay | Admitting: Gastroenterology

## 2012-07-19 ENCOUNTER — Encounter: Payer: Self-pay | Admitting: Gastroenterology

## 2013-11-06 ENCOUNTER — Encounter: Payer: Medicare Other | Admitting: Internal Medicine

## 2013-11-12 ENCOUNTER — Encounter: Payer: Self-pay | Admitting: Internal Medicine

## 2013-11-12 DIAGNOSIS — H409 Unspecified glaucoma: Secondary | ICD-10-CM | POA: Diagnosis not present

## 2013-11-12 DIAGNOSIS — Z961 Presence of intraocular lens: Secondary | ICD-10-CM | POA: Diagnosis not present

## 2013-11-12 DIAGNOSIS — E119 Type 2 diabetes mellitus without complications: Secondary | ICD-10-CM | POA: Diagnosis not present

## 2013-11-12 DIAGNOSIS — H4011X0 Primary open-angle glaucoma, stage unspecified: Secondary | ICD-10-CM | POA: Diagnosis not present

## 2013-11-12 LAB — HM DIABETES EYE EXAM

## 2013-11-14 ENCOUNTER — Encounter: Payer: Self-pay | Admitting: Internal Medicine

## 2013-11-14 ENCOUNTER — Ambulatory Visit (INDEPENDENT_AMBULATORY_CARE_PROVIDER_SITE_OTHER): Payer: Medicare Other | Admitting: Internal Medicine

## 2013-11-14 VITALS — BP 150/82 | HR 70 | Temp 97.9°F | Wt 169.5 lb

## 2013-11-14 DIAGNOSIS — E119 Type 2 diabetes mellitus without complications: Secondary | ICD-10-CM | POA: Diagnosis not present

## 2013-11-14 DIAGNOSIS — E785 Hyperlipidemia, unspecified: Secondary | ICD-10-CM

## 2013-11-14 DIAGNOSIS — I10 Essential (primary) hypertension: Secondary | ICD-10-CM | POA: Diagnosis not present

## 2013-11-14 DIAGNOSIS — N320 Bladder-neck obstruction: Secondary | ICD-10-CM | POA: Diagnosis not present

## 2013-11-14 MED ORDER — BETAMETHASONE VALERATE 0.1 % EX CREA: TOPICAL_CREAM | Freq: Two times a day (BID) | CUTANEOUS | Status: DC

## 2013-11-14 MED ORDER — AMILORIDE-HYDROCHLOROTHIAZIDE 5-50 MG PO TABS: ORAL_TABLET | ORAL | Status: DC

## 2013-11-14 MED ORDER — METFORMIN HCL 500 MG PO TABS: 500.0000 mg | ORAL_TABLET | Freq: Three times a day (TID) | ORAL | Status: DC

## 2013-11-14 MED ORDER — ATORVASTATIN CALCIUM 10 MG PO TABS: 10.0000 mg | ORAL_TABLET | Freq: Every day | ORAL | Status: DC

## 2013-11-14 MED ORDER — AMLODIPINE BESYLATE 10 MG PO TABS: 10.0000 mg | ORAL_TABLET | Freq: Every day | ORAL | Status: DC

## 2013-11-14 MED ORDER — GLUCOSE BLOOD VI STRP: ORAL_STRIP | Status: DC

## 2013-11-14 NOTE — Progress Notes (Signed)
Pre visit review using our clinic review tool, if applicable. No additional management support is needed unless otherwise documented below in the visit note. 

## 2013-11-14 NOTE — Progress Notes (Signed)
Subjective:    Patient ID: Raymond Dean, male    DOB: 11/14/1933, 80 y.o.   MRN: [id number]  HPI  Here for yearly f/u, has been living in [place] for 2 yrs as tropics are better for his t-cell lymphoma ;  Overall doing ok;  Pt denies CP, worsening SOB, DOE, wheezing, orthopnea, PND, worsening LE edema, palpitations, dizziness or syncope.  Pt denies neurological change such as new headache, facial or extremity weakness.  Pt denies polydipsia, polyuria, or low sugar symptoms. Pt states overall good compliance with treatment and medications, good tolerability, and has been trying to follow lower cholesterol diet.  Pt denies worsening depressive symptoms, suicidal ideation or panic. No fever, night sweats, wt loss, loss of appetite, or other constitutional symptoms.  Pt states good ability with ADL's, has low fall risk, home safety reviewed and adequate, no other significant changes in hearing or vision, and only occasionally active with exercise. Needs refill for rash  - betameth cr .1 %.  Also tramadol refill for pain, Pt continues to have recurring LBP without change in severity, bowel or bladder change, fever, wt loss,  worsening LE pain/numbness/weakness, gait change or falls, pain now radiating to thighs as well. Also taking daonil (for DM) with the metformin since in [place].   CBG's in low 100-'s.  Has not taken BP med today.   Past Medical History  Diagnosis Date  . DIABETES MELLITUS, TYPE II 02/01/2007    Qualifier: Diagnosis of  By: Jenny Reichmann MD, Hunt Oris   . HYPERLIPIDEMIA 11/02/2006    Qualifier: Diagnosis of  By: Jenny Reichmann MD, Hunt Oris   . HYPERTENSION 09/12/2006    Qualifier: Diagnosis of  By: Elveria Royals   . ANEMIA-NOS 03/14/2007    Qualifier: Diagnosis of  By: Jenny Reichmann MD, Hunt Oris   . Eosinophilia 11/05/2009    Qualifier: Diagnosis of  By: Jenny Reichmann MD, Hunt Oris   . GLAUCOMA 11/05/2009    Qualifier: Diagnosis of  By: Jenny Reichmann MD, Hunt Oris   . DIVERTICULOSIS, COLON 02/01/2007    Qualifier: Diagnosis  of  By: Jenny Reichmann MD, Hunt Oris   . RENAL INSUFFICIENCY 03/14/2007    Qualifier: Diagnosis of  By: Jenny Reichmann MD, Hunt Oris   . BENIGN PROSTATIC HYPERTROPHY 11/02/2006    Qualifier: Diagnosis of  By: Jenny Reichmann MD, Hunt Oris   . 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  reports that he has never smoked. He has never used smokeless tobacco. He reports that he drinks alcohol. He reports that he does not use illicit drugs. family history includes Diabetes in his mother; Hypertension in his father and sister; Stroke in his brother and mother. Allergies  Allergen Reactions  . Ace Inhibitors    Current Outpatient Prescriptions on File Prior to Visit  Medication Sig Dispense Refill  . traMADol (ULTRAM) 50 MG tablet 1-2 tabs by mouth up to 4 times per day  240 tablet  3   No current facility-administered medications on file prior to visit.    Review of Systems Constitutional: Negative for increased diaphoresis, other activity, appetite or other siginficant weight change  HENT: Negative for worsening hearing loss, ear pain, facial swelling, mouth sores and neck stiffness.   Eyes: Negative for other worsening pain, redness or visual disturbance.  Respiratory: Negative for shortness of breath and wheezing.   Cardiovascular: Negative for chest pain and palpitations.  Gastrointestinal: Negative for diarrhea, blood in stool, abdominal distention or other  pain Genitourinary: Negative for hematuria, flank pain or change in urine volume.  Musculoskeletal: Negative for myalgias or other joint complaints.  Skin: Negative for color change and wound.  Neurological: Negative for syncope and numbness. other than noted Hematological: Negative for adenopathy. or other swelling Psychiatric/Behavioral: Negative for hallucinations, self-injury, decreased concentration or other worsening agitation.      Objective:   Physical Exam BP 150/82  Pulse 70  Temp(Src) 97.9 F (36.6 C) (Oral)  Wt 169  lb 8 oz (76.885 kg)  SpO2 96% VS noted,  Constitutional: Pt is oriented to person, place, and time. Appears well-developed and well-nourished.  Head: Normocephalic and atraumatic.  Right Ear: External ear normal.  Left Ear: External ear normal.  Nose: Nose normal.  Mouth/Throat: Oropharynx is clear and moist.  Eyes: Conjunctivae and EOM are normal. Pupils are equal, round, and reactive to light.  Neck: Normal range of motion. Neck supple. No JVD present. No tracheal deviation present.  Cardiovascular: Normal rate, regular rhythm, normal heart sounds and intact distal pulses.   Pulmonary/Chest: Effort normal and breath sounds without rales or wheezing  Abdominal: Soft. Bowel sounds are normal. NT. No HSM  Musculoskeletal: Normal range of motion. Exhibits no edema.  Lymphadenopathy:  Has no cervical adenopathy.  Neurological: Pt is alert and oriented to person, place, and time. Pt has normal reflexes. No cranial nerve deficit. Motor grossly intact Skin: Skin is warm and dry. No rash noted.  Psychiatric:  Has normal mood and affect. Behavior is normal.      Assessment & Plan:

## 2013-11-14 NOTE — Patient Instructions (Signed)
You had the new Prevnar pneumonia shot today  Please continue all other medications as before, and refills have been done if requested.  Please have the pharmacy call with any other refills you may need.  Please continue your efforts at being more active, low cholesterol diet, and weight control.  You are otherwise up to date with prevention measures today.  Please keep your appointments with your specialists as you may have planned  Please go to the LAB in the Basement (turn left off the elevator) for the tests to be done tomorrow  You will be contacted by phone if any changes need to be made immediately.  Otherwise, you will receive a letter about your results with an explanation, but please check with MyChart first.  Please remember to sign up for MyChart if you have not done so, as this will be important to you in the future with finding out test results, communicating by private email, and scheduling acute appointments online when needed.  Please return in 6 months, or sooner if needed

## 2013-11-15 ENCOUNTER — Encounter: Payer: Self-pay | Admitting: Internal Medicine

## 2013-11-15 ENCOUNTER — Other Ambulatory Visit (INDEPENDENT_AMBULATORY_CARE_PROVIDER_SITE_OTHER): Payer: Medicare Other

## 2013-11-15 ENCOUNTER — Telehealth: Payer: Self-pay | Admitting: Internal Medicine

## 2013-11-15 DIAGNOSIS — N320 Bladder-neck obstruction: Secondary | ICD-10-CM

## 2013-11-15 DIAGNOSIS — E119 Type 2 diabetes mellitus without complications: Secondary | ICD-10-CM

## 2013-11-15 DIAGNOSIS — R972 Elevated prostate specific antigen [PSA]: Secondary | ICD-10-CM

## 2013-11-15 DIAGNOSIS — N183 Chronic kidney disease, stage 3 (moderate): Secondary | ICD-10-CM

## 2013-11-15 LAB — CBC WITH DIFFERENTIAL/PLATELET
Basophils Absolute: 0.1 10*3/uL (ref 0.0–0.1)
Basophils Relative: 0.8 % (ref 0.0–3.0)
Eosinophils Absolute: 1.3 10*3/uL — ABNORMAL HIGH (ref 0.0–0.7)
HCT: 37 % — ABNORMAL LOW (ref 39.0–52.0)
Hemoglobin: 12.2 g/dL — ABNORMAL LOW (ref 13.0–17.0)
Lymphocytes Relative: 29 % (ref 12.0–46.0)
Lymphs Abs: 2.3 10*3/uL (ref 0.7–4.0)
MCHC: 32.9 g/dL (ref 30.0–36.0)
MCV: 79.4 fl (ref 78.0–100.0)
MONOS PCT: 6.9 % (ref 3.0–12.0)
Monocytes Absolute: 0.6 10*3/uL (ref 0.1–1.0)
NEUTROS PCT: 47.2 % (ref 43.0–77.0)
Neutro Abs: 3.8 10*3/uL (ref 1.4–7.7)
Platelets: 228 10*3/uL (ref 150.0–400.0)
RBC: 4.66 Mil/uL (ref 4.22–5.81)
RDW: 14.4 % (ref 11.5–15.5)
WBC: 8 10*3/uL (ref 4.0–10.5)

## 2013-11-15 LAB — BASIC METABOLIC PANEL
BUN: 38 mg/dL — AB (ref 6–23)
CALCIUM: 9.9 mg/dL (ref 8.4–10.5)
CO2: 20 mEq/L (ref 19–32)
Chloride: 106 mEq/L (ref 96–112)
Creatinine, Ser: 2.4 mg/dL — ABNORMAL HIGH (ref 0.4–1.5)
GFR: 34.65 mL/min — AB (ref >60.00)
Glucose, Bld: 161 mg/dL — ABNORMAL HIGH (ref 70–99)
POTASSIUM: 4.4 meq/L (ref 3.5–5.1)
SODIUM: 139 meq/L (ref 135–145)

## 2013-11-15 LAB — URINALYSIS, ROUTINE W REFLEX MICROSCOPIC
Bilirubin Urine: NEGATIVE
Hgb urine dipstick: NEGATIVE
KETONES UR: NEGATIVE
LEUKOCYTES UA: NEGATIVE
NITRITE: NEGATIVE
PH: 6 (ref 5.0–8.0)
RBC / HPF: NONE SEEN (ref 0–?)
Specific Gravity, Urine: 1.01 (ref 1.000–1.030)
Total Protein, Urine: NEGATIVE
UROBILINOGEN UA: 0.2 (ref 0.0–1.0)
Urine Glucose: NEGATIVE
WBC UA: NONE SEEN (ref 0–?)

## 2013-11-15 LAB — HEPATIC FUNCTION PANEL
ALBUMIN: 4.3 g/dL (ref 3.5–5.2)
ALT: 29 U/L (ref 0–53)
AST: 26 U/L (ref 0–37)
Alkaline Phosphatase: 60 U/L (ref 39–117)
Bilirubin, Direct: 0.1 mg/dL (ref 0.0–0.3)
Total Bilirubin: 0.6 mg/dL (ref 0.2–1.2)
Total Protein: 7.9 g/dL (ref 6.0–8.3)

## 2013-11-15 LAB — TSH: TSH: 2.21 u[IU]/mL (ref 0.35–4.50)

## 2013-11-15 LAB — LIPID PANEL
CHOL/HDL RATIO: 3
CHOLESTEROL: 169 mg/dL (ref 0–200)
HDL: 54.6 mg/dL (ref >39.00)
LDL CALC: 103 mg/dL — AB (ref 0–99)
NonHDL: 114.4
TRIGLYCERIDES: 57 mg/dL (ref 0.0–149.0)
VLDL: 11.4 mg/dL (ref 0.0–40.0)

## 2013-11-15 LAB — MICROALBUMIN / CREATININE URINE RATIO
Creatinine,U: 125.4 mg/dL
MICROALB/CREAT RATIO: 3 mg/g (ref 0.0–30.0)
Microalb, Ur: 3.7 mg/dL — ABNORMAL HIGH (ref 0.0–1.9)

## 2013-11-15 LAB — HEMOGLOBIN A1C: HEMOGLOBIN A1C: 6.4 % (ref 4.6–6.5)

## 2013-11-15 LAB — PSA: PSA: 7.84 ng/mL — ABNORMAL HIGH (ref 0.10–4.00)

## 2013-11-15 NOTE — Telephone Encounter (Signed)
Patient is requesting scripts for daonil 5mg  (90 day supply) and tramadol to be sent to [facility] [place]. He states Dr. Jenny Reichmann was to send yesterday but pharmacy does not have.

## 2013-11-15 NOTE — Assessment & Plan Note (Signed)
stable overall by history and exam, recent data reviewed with pt, and pt to continue medical treatment as before,  to f/u any worsening symptoms or concerns Lab Results  Component Value Date   HGBA1C 6.9* 10/11/2011

## 2013-11-15 NOTE — Assessment & Plan Note (Signed)
stable overall by history and exam, recent data reviewed with pt, and pt to continue medical treatment as before,  to f/u any worsening symptoms or concerns Lab Results  Component Value Date   LDLCALC 77 10/11/2011   for f/u labs

## 2013-11-15 NOTE — Assessment & Plan Note (Signed)
stable overall by history and exam, recent data reviewed with pt, and pt to continue medical treatment as before,  to f/u any worsening symptoms or concerns BP Readings from Last 3 Encounters:  11/14/13 150/82  10/11/11 144/76  11/05/09 148/90

## 2013-11-15 NOTE — Telephone Encounter (Signed)
Gave pt results and recommendation,   Pt stated to go ahead and make referrals and depending on the time line will further pt decision.

## 2013-11-15 NOTE — Telephone Encounter (Signed)
[place] for referrals  Ok to alert [facility] 's to this as well

## 2013-11-15 NOTE — Assessment & Plan Note (Signed)
Also for psa as he is due 

## 2013-11-21 ENCOUNTER — Other Ambulatory Visit: Payer: Self-pay

## 2013-11-21 MED ORDER — AURORA LANCET SUPER THIN 30G MISC: Status: DC

## 2013-11-26 DIAGNOSIS — N401 Enlarged prostate with lower urinary tract symptoms: Secondary | ICD-10-CM | POA: Diagnosis not present

## 2013-11-26 DIAGNOSIS — R972 Elevated prostate specific antigen [PSA]: Secondary | ICD-10-CM | POA: Diagnosis not present

## 2014-03-21 ENCOUNTER — Telehealth: Payer: Self-pay | Admitting: *Deleted

## 2014-03-21 NOTE — Telephone Encounter (Signed)
LVM to see if pt already had Flu shot done or need an appointment to get one. 

## 2014-05-08 ENCOUNTER — Telehealth: Payer: Self-pay

## 2014-05-08 NOTE — Telephone Encounter (Signed)
Left voice mail for pt to call back if he still wants flu vaccine 

## 2015-12-29 ENCOUNTER — Encounter: Payer: PRIVATE HEALTH INSURANCE | Admitting: Internal Medicine

## 2016-01-07 DIAGNOSIS — E119 Type 2 diabetes mellitus without complications: Secondary | ICD-10-CM | POA: Diagnosis not present

## 2016-01-07 DIAGNOSIS — Z961 Presence of intraocular lens: Secondary | ICD-10-CM | POA: Diagnosis not present

## 2016-01-07 DIAGNOSIS — H5213 Myopia, bilateral: Secondary | ICD-10-CM | POA: Diagnosis not present

## 2016-01-07 DIAGNOSIS — H401131 Primary open-angle glaucoma, bilateral, mild stage: Secondary | ICD-10-CM | POA: Diagnosis not present

## 2016-01-07 DIAGNOSIS — H52203 Unspecified astigmatism, bilateral: Secondary | ICD-10-CM | POA: Diagnosis not present

## 2016-01-07 DIAGNOSIS — H524 Presbyopia: Secondary | ICD-10-CM | POA: Diagnosis not present

## 2016-01-22 ENCOUNTER — Ambulatory Visit (INDEPENDENT_AMBULATORY_CARE_PROVIDER_SITE_OTHER): Payer: Medicare Other | Admitting: Internal Medicine

## 2016-01-22 ENCOUNTER — Encounter: Payer: Self-pay | Admitting: Internal Medicine

## 2016-01-22 ENCOUNTER — Other Ambulatory Visit (INDEPENDENT_AMBULATORY_CARE_PROVIDER_SITE_OTHER): Payer: Medicare Other

## 2016-01-22 VITALS — BP 130/70 | HR 70 | Temp 98.2°F | Resp 20 | Wt 174.0 lb

## 2016-01-22 DIAGNOSIS — R011 Cardiac murmur, unspecified: Secondary | ICD-10-CM

## 2016-01-22 DIAGNOSIS — I872 Venous insufficiency (chronic) (peripheral): Secondary | ICD-10-CM | POA: Insufficient documentation

## 2016-01-22 DIAGNOSIS — N183 Chronic kidney disease, stage 3 unspecified: Secondary | ICD-10-CM

## 2016-01-22 DIAGNOSIS — Z23 Encounter for immunization: Secondary | ICD-10-CM | POA: Diagnosis not present

## 2016-01-22 DIAGNOSIS — E119 Type 2 diabetes mellitus without complications: Secondary | ICD-10-CM

## 2016-01-22 DIAGNOSIS — S0990XA Unspecified injury of head, initial encounter: Secondary | ICD-10-CM | POA: Insufficient documentation

## 2016-01-22 DIAGNOSIS — I10 Essential (primary) hypertension: Secondary | ICD-10-CM

## 2016-01-22 DIAGNOSIS — R972 Elevated prostate specific antigen [PSA]: Secondary | ICD-10-CM

## 2016-01-22 DIAGNOSIS — R413 Other amnesia: Secondary | ICD-10-CM

## 2016-01-22 DIAGNOSIS — H9191 Unspecified hearing loss, right ear: Secondary | ICD-10-CM

## 2016-01-22 DIAGNOSIS — E785 Hyperlipidemia, unspecified: Secondary | ICD-10-CM

## 2016-01-22 DIAGNOSIS — M25561 Pain in right knee: Secondary | ICD-10-CM

## 2016-01-22 LAB — CBC WITH DIFFERENTIAL/PLATELET
BASOS PCT: 0.5 % (ref 0.0–3.0)
Basophils Absolute: 0 10*3/uL (ref 0.0–0.1)
EOS ABS: 1.4 10*3/uL — AB (ref 0.0–0.7)
Eosinophils Relative: 17.2 % — ABNORMAL HIGH (ref 0.0–5.0)
HEMATOCRIT: 39.1 % (ref 39.0–52.0)
Hemoglobin: 12.9 g/dL — ABNORMAL LOW (ref 13.0–17.0)
LYMPHS PCT: 35 % (ref 12.0–46.0)
Lymphs Abs: 2.9 10*3/uL (ref 0.7–4.0)
MCHC: 33.1 g/dL (ref 30.0–36.0)
MCV: 79.7 fl (ref 78.0–100.0)
MONOS PCT: 7.5 % (ref 3.0–12.0)
Monocytes Absolute: 0.6 10*3/uL (ref 0.1–1.0)
NEUTROS ABS: 3.3 10*3/uL (ref 1.4–7.7)
Neutrophils Relative %: 39.8 % — ABNORMAL LOW (ref 43.0–77.0)
PLATELETS: 212 10*3/uL (ref 150.0–400.0)
RBC: 4.9 Mil/uL (ref 4.22–5.81)
RDW: 14.7 % (ref 11.5–15.5)
WBC: 8.2 10*3/uL (ref 4.0–10.5)

## 2016-01-22 LAB — URINALYSIS, ROUTINE W REFLEX MICROSCOPIC
Bilirubin Urine: NEGATIVE
Hgb urine dipstick: NEGATIVE
Ketones, ur: NEGATIVE
Leukocytes, UA: NEGATIVE
Nitrite: NEGATIVE
PH: 5.5 (ref 5.0–8.0)
Specific Gravity, Urine: 1.01 (ref 1.000–1.030)
TOTAL PROTEIN, URINE-UPE24: NEGATIVE
Urine Glucose: NEGATIVE
Urobilinogen, UA: 0.2 (ref 0.0–1.0)
WBC UA: NONE SEEN — AB (ref 0–?)

## 2016-01-22 LAB — MICROALBUMIN / CREATININE URINE RATIO
CREATININE, U: 81.4 mg/dL
MICROALB UR: 5.9 mg/dL — AB (ref 0.0–1.9)
MICROALB/CREAT RATIO: 7.2 mg/g (ref 0.0–30.0)

## 2016-01-22 LAB — PSA: PSA: 6.8 ng/mL — ABNORMAL HIGH (ref 0.10–4.00)

## 2016-01-22 LAB — BASIC METABOLIC PANEL
BUN: 25 mg/dL — ABNORMAL HIGH (ref 6–23)
CHLORIDE: 102 meq/L (ref 96–112)
CO2: 27 mEq/L (ref 19–32)
CREATININE: 1.61 mg/dL — AB (ref 0.40–1.50)
Calcium: 9.9 mg/dL (ref 8.4–10.5)
GFR: 53.31 mL/min — ABNORMAL LOW (ref >60.00)
Glucose, Bld: 106 mg/dL — ABNORMAL HIGH (ref 70–99)
Potassium: 4 mEq/L (ref 3.5–5.1)
SODIUM: 140 meq/L (ref 135–145)

## 2016-01-22 LAB — TSH: TSH: 2.96 u[IU]/mL (ref 0.35–4.50)

## 2016-01-22 LAB — HEMOGLOBIN A1C: Hgb A1c MFr Bld: 6.6 % — ABNORMAL HIGH (ref 4.6–6.5)

## 2016-01-22 LAB — LIPID PANEL
CHOL/HDL RATIO: 3
Cholesterol: 177 mg/dL (ref 0–200)
HDL: 68.8 mg/dL (ref >39.00)
LDL Cholesterol: 94 mg/dL (ref 0–99)
NONHDL: 107.72
Triglycerides: 68 mg/dL (ref 0.0–149.0)
VLDL: 13.6 mg/dL (ref 0.0–40.0)

## 2016-01-22 LAB — HEPATIC FUNCTION PANEL
ALK PHOS: 68 U/L (ref 39–117)
ALT: 22 U/L (ref 0–53)
AST: 20 U/L (ref 0–37)
Albumin: 4.5 g/dL (ref 3.5–5.2)
BILIRUBIN DIRECT: 0.2 mg/dL (ref 0.0–0.3)
BILIRUBIN TOTAL: 0.8 mg/dL (ref 0.2–1.2)
Total Protein: 8.6 g/dL — ABNORMAL HIGH (ref 6.0–8.3)

## 2016-01-22 MED ORDER — ASPIRIN EC 81 MG PO TBEC: 81.0000 mg | DELAYED_RELEASE_TABLET | Freq: Every day | ORAL | 11 refills | Status: AC

## 2016-01-22 MED ORDER — AMLODIPINE BESYLATE 10 MG PO TABS: 10.0000 mg | ORAL_TABLET | Freq: Every day | ORAL | 3 refills | Status: AC

## 2016-01-22 MED ORDER — METFORMIN HCL 500 MG PO TABS: 500.0000 mg | ORAL_TABLET | Freq: Three times a day (TID) | ORAL | 3 refills | Status: AC

## 2016-01-22 MED ORDER — ATORVASTATIN CALCIUM 10 MG PO TABS: 10.0000 mg | ORAL_TABLET | Freq: Every day | ORAL | 3 refills | Status: AC

## 2016-01-22 MED ORDER — AURORA LANCET SUPER THIN 30G MISC: 11 refills | Status: AC

## 2016-01-22 MED ORDER — TRAMADOL HCL 50 MG PO TABS: ORAL_TABLET | ORAL | 5 refills | Status: AC

## 2016-01-22 MED ORDER — AMILORIDE-HYDROCHLOROTHIAZIDE 5-50 MG PO TABS: ORAL_TABLET | ORAL | 3 refills | Status: AC

## 2016-01-22 MED ORDER — GLUCOSE BLOOD VI STRP: ORAL_STRIP | 11 refills | Status: AC

## 2016-01-22 MED ORDER — BETAMETHASONE VALERATE 0.1 % EX CREA: TOPICAL_CREAM | Freq: Two times a day (BID) | CUTANEOUS | 2 refills | Status: AC

## 2016-01-22 NOTE — Progress Notes (Signed)
Pre visit review using our clinic review tool, if applicable. No additional management support is needed unless otherwise documented below in the visit note. 

## 2016-01-22 NOTE — Patient Instructions (Signed)
Your right ear was irrigated of wax today  You had the flu shot today  Please continue all other medications as before, and refills have been done if requested, except for the Daonil 5 mg, since this medication is not available in this country, and you have a supply at home  Please have the pharmacy call with any other refills you may need.  Please continue your efforts at being more active, low cholesterol diet, and weight control.  You are otherwise up to date with prevention measures today.  Please keep your appointments with your specialists as you may have planned  You will be contacted regarding the referral for: MRI for head, and Dr Tamala Julian for the right knee  (you can also make an appt at the checkout desk for Dr Tamala Julian as well since he is here in this office)  Please go to the LAB in the Basement (turn left off the elevator) for the tests to be done today  You will be contacted by phone if any changes need to be made immediately.  Otherwise, you will receive a letter about your results with an explanation, but please check with MyChart first.  Please remember to sign up for MyChart if you have not done so, as this will be important to you in the future with finding out test results, communicating by private email, and scheduling acute appointments online when needed.  Please return in 6 months, or sooner if needed

## 2016-01-22 NOTE — Progress Notes (Signed)
Subjective:    Patient ID: Raymond Dean, male    DOB: 12/16/1933, 82 y.o.   MRN: [id number]  HPI  Here for wellness and f/u after last seen sept 2015; Lives in [place] and comes for evaluation yearly but missed last yr.   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 Has gradually worsening right knee pain and  Occasional swelling, but no giveaways.  Has fallen twice recently, once to hard ground about 1 mo ago with what sounds like large left facial bruising, apparently without fracture, wound dressed per nigerian MD and now healed, no imaging available to be done at the time. First episode was when he got off balance simply trying moving too fast without cane, then second time he trippped.over something with left foot.   Pt unsure if some cognitive slowing was before or after fall, but Pt denies new neurological symptoms such as new headache, or facial or extremity weakness or numbness  Pt denies Chest pain, worsening SOB, DOE, wheezing, orthopnea, PND, worsening LE edema, palpitations, dizziness or syncope.  Pt denies polydipsia, polyuria or low sugars. Wt about the same around 170 or so. Wt Readings from Last 3 Encounters:  01/22/16 174 lb (78.9 kg)  11/14/13 169 lb 8 oz (76.9 kg)  10/11/11 171 lb 6 oz (77.7 kg)   Pt states overall good compliance with treatment and medications including  New med for DM not available in the [place] (I think is a 5 mg type of sulfonylurea called daonil), overall good tolerability, and has been trying to follow appropriate diet.  Pt denies worsening depressive symptoms, suicidal ideation or panic. No fever, night sweats, wt loss, loss of appetite, or other constitutional symptoms.  Pt states fairly good ability with ADL's, has low to mod fall risk as  long as he uses the cane, home safety reviewed and adequate, no other significant changes in hearing (except for right ear hearing loss in the past wk  - ? Wax) or vision (including post fall), and no longer active with exercise except for occasional very short walks.  Of note is when last seen he had unexplained elevated Cr about 2.4 (with < 2 at baseline) and elevated PSA, referred for nephrology and urology but not seen as he went back to [place].  Today states he will go eventually back to [place] to be with family but will stay if any health issues need further addressed, as the nigerian MD only follows BP and sugar.  Denies urinary symptoms such as dysuria, frequency, urgency, flank pain, hematuria or n/v, fever, chills, but has been taking saw palmetto due to slower flow and seems to help enough.  Due for flu shot   Past Medical History:  Diagnosis Date  . ANEMIA-NOS 03/14/2007   Qualifier: Diagnosis of  By: Jenny Reichmann MD, Hunt Oris   . BENIGN PROSTATIC HYPERTROPHY 11/02/2006   Qualifier: Diagnosis of  By: Jenny Reichmann MD, Hunt Oris   . 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 DIABETES MELLITUS, TYPE II 02/01/2007   Qualifier: Diagnosis of  By: Jenny Reichmann MD, Hunt Oris   . DIVERTICULOSIS, COLON 02/01/2007   Qualifier: Diagnosis of  By: Jenny Reichmann MD, Hunt Oris   . Eosinophilia 11/05/2009   Qualifier: Diagnosis of  By:  Jenny Reichmann MD, Hunt Oris   . GLAUCOMA 11/05/2009   Qualifier: Diagnosis of  By: Jenny Reichmann MD, Hunt Oris   . HYPERLIPIDEMIA 11/02/2006   Qualifier: Diagnosis of  By: Jenny Reichmann MD, Hunt Oris   . HYPERTENSION 09/12/2006   Qualifier: Diagnosis of  By: Elveria Royals   . RENAL INSUFFICIENCY 03/14/2007   Qualifier: Diagnosis of  By: Jenny Reichmann MD, Hunt Oris    No past surgical history on file.  reports that he has never smoked. He has never used smokeless tobacco. He reports that he drinks alcohol. He reports that he does not use drugs. family history includes Diabetes in his mother; Hypertension in his father and sister; Stroke in  his brother and mother. Allergies  Allergen Reactions  . Ace Inhibitors    Current Outpatient Prescriptions on File Prior to Visit  Medication Sig Dispense Refill  . latanoprost (XALATAN) 0.005 % ophthalmic solution Place 1 drop into both eyes at bedtime.     No current facility-administered medications on file prior to visit.    Review of Systems Constitutional: Negative for increased diaphoresis, or other activity, appetite or siginficant weight change other than noted HENT: Negative for worsening hearing loss, ear pain, facial swelling, mouth sores and neck stiffness.   Eyes: Negative for other worsening pain, redness or visual disturbance.  Respiratory: Negative for choking or stridor Cardiovascular: Negative for other chest pain and palpitations.  Gastrointestinal: Negative for worsening diarrhea, blood in stool, or abdominal distention Genitourinary: Negative for hematuria, flank pain or change in urine volume.  Musculoskeletal: Negative for myalgias or other joint complaints.  Skin: Negative for other color change and wound or drainage.  Neurological: Negative for syncope and numbness. other than noted Hematological: Negative for adenopathy. or other swelling Psychiatric/Behavioral: Negative for hallucinations, SI, self-injury, decreased concentration or other worsening agitation.  All other system neg per pt    Objective:   Physical Exam BP 130/70   Pulse 70   Temp 98.2 F (36.8 C) (Oral)   Resp 20   Wt 174 lb (78.9 kg)   SpO2 96%   BMI 24.97 kg/m  VS noted,  Constitutional: Pt is oriented to person, place, and time. Appears well-developed and well-nourished, in no significant distress Head: Normocephalic and atraumatic  Eyes: Conjunctivae and EOM are normal. Pupils are equal, round, and reactive to light Right Ear: External ear normal.  Left Ear: External ear normal Right canal clear after wax impaction irrigated Nose: Nose normal.  Mouth/Throat: Oropharynx is  clear and moist  Neck: Normal range of motion. Neck supple. No JVD present. No tracheal deviation present or significant neck LA or mass Cardiovascular: Normal rate, regular rhythm, normal heart sounds and intact distal pulses.  except for gr 2/6 systolic murmur LLSB Pulmonary/Chest: Effort normal and breath sounds without rales or wheezing  Abdominal: Soft. Bowel sounds are normal. NT. No HSM  Musculoskeletal: Normal range of motion. Exhibits bilater trace to 1+ pedal edema Lymphadenopathy: Has no cervical adenopathy.  Neurological: Pt is alert and oriented to person, place, and time. Pt has normal reflexes. No cranial nerve deficit. Motor grossly intact Skin: Skin is warm and dry. No rash noted or new ulcers Psychiatric:  Has normal mood and affect. Behavior is normal.  No other new exam findings    Assessment & Plan:

## 2016-01-23 DIAGNOSIS — R011 Cardiac murmur, unspecified: Secondary | ICD-10-CM | POA: Insufficient documentation

## 2016-01-23 NOTE — Assessment & Plan Note (Signed)
Improved with irrigation 

## 2016-01-23 NOTE — Assessment & Plan Note (Signed)
Slight improved over last visit though still elevated, Lab Results  Component Value Date   PSA 6.80 (H) 01/22/2016   PSA 7.84 (H) 11/15/2013   PSA 4.46 (H) 10/11/2011   Declines urology referral

## 2016-01-23 NOTE — Assessment & Plan Note (Addendum)
stable overall by history and exam, recent data reviewed with pt, and pt to continue medical treatment as before,  to f/u any worsening symptoms or concerns Lab Results  Component Value Date   HGBA1C 6.6 (H) 01/22/2016   Note:  Total time for pt hx, exam, review of record with pt in the room, determination of diagnoses and plan for further eval and tx is > 40 min, with over 50% spent in coordination and counseling of patient

## 2016-01-23 NOTE — Assessment & Plan Note (Signed)
With new mild bipedal edema, ok to follow for now, consider low dose hct

## 2016-01-23 NOTE — Assessment & Plan Note (Signed)
With worsening degenerative changes it seems and pain, with now higher risk of recurrent falls, for referral Dr Tamala Julian, sports medicine

## 2016-01-23 NOTE — Assessment & Plan Note (Signed)
?   MR type murmur, I think new and worsening since last seen, but denies symtpoms, declines echo and ecg for now

## 2016-01-23 NOTE — Assessment & Plan Note (Signed)
With ? Worsening with cr about 2.4 last visit 2 yrs ago, but Lab Results  Component Value Date   CREATININE 1.61 (H) 01/22/2016   Now improved, suspect at baseline, will cont to follow, declines nephrology referral

## 2016-01-23 NOTE — Assessment & Plan Note (Signed)
stable overall by history and exam, recent data reviewed with pt, and pt to continue medical treatment as before,  to f/u any worsening symptoms or concerns Lab Results  Component Value Date   LDLCALC 94 01/22/2016

## 2016-01-23 NOTE — Assessment & Plan Note (Signed)
Has some mild cognitive slowing more than I appreciated at last visit but has been 2 yrs and possibly age related, but did have recent head trauma without cns imaging 1 mo ago, ok for Head MRI, pt declines referral for neurocognitive evaluation

## 2016-01-23 NOTE — Assessment & Plan Note (Signed)
See above head injury

## 2016-01-23 NOTE — Assessment & Plan Note (Signed)
stable overall by history and exam, recent data reviewed with pt, and pt to continue medical treatment as before,  to f/u any worsening symptoms or concerns BP Readings from Last 3 Encounters:  01/22/16 130/70  11/14/13 (!) 150/82  10/11/11 (!) 144/76

## 2016-01-25 ENCOUNTER — Telehealth: Payer: Self-pay | Admitting: Internal Medicine

## 2016-01-25 NOTE — Telephone Encounter (Signed)
Patient sent in [place] message stating the following...  No foot exam was done on Jan 22, 2016. Toes seem darker than before. need circulation assurance. No physical exam of prostate. Need before Dec. 8th.  Is that what the ortho surgeon referral is for? Do you do a prostate exam? Please advise.

## 2016-01-26 ENCOUNTER — Telehealth: Payer: Self-pay | Admitting: Internal Medicine

## 2016-01-26 NOTE — Telephone Encounter (Signed)
No prostate exam needed since this is not recommended by the [place] [employer] for [employer] for the past 8 yrs (was recommended only prior to that)  [facility] for strips but has already been done dec 1  Not sure why he would need the tramadol since this was prescribed Dec 1 (4 days ago); please ask pt to check his papers he was given at d/c  [facility] for foot exam appt if he likes, though skin discoloration/darkening is not a sign of poor circulation

## 2016-01-26 NOTE — Telephone Encounter (Signed)
Called pt and gave him [facility] number

## 2016-01-26 NOTE — Telephone Encounter (Signed)
Please contact about MRI referral.

## 2016-01-26 NOTE — Telephone Encounter (Signed)
Patient also states he needs one touch ultra strips and tramadol.  Will schedule patient for foot exam and possible prostate exam.

## 2016-01-26 NOTE — Telephone Encounter (Signed)
Patient messaged to advise

## 2016-01-27 ENCOUNTER — Encounter: Payer: Self-pay | Admitting: Internal Medicine

## 2016-01-27 ENCOUNTER — Ambulatory Visit (INDEPENDENT_AMBULATORY_CARE_PROVIDER_SITE_OTHER): Payer: Medicare Other | Admitting: Internal Medicine

## 2016-01-27 VITALS — BP 138/78 | HR 66 | Temp 98.1°F | Resp 20 | Wt 174.0 lb

## 2016-01-27 DIAGNOSIS — D721 Eosinophilia, unspecified: Secondary | ICD-10-CM

## 2016-01-27 DIAGNOSIS — R778 Other specified abnormalities of plasma proteins: Secondary | ICD-10-CM | POA: Insufficient documentation

## 2016-01-27 DIAGNOSIS — R972 Elevated prostate specific antigen [PSA]: Secondary | ICD-10-CM

## 2016-01-27 DIAGNOSIS — I872 Venous insufficiency (chronic) (peripheral): Secondary | ICD-10-CM | POA: Diagnosis not present

## 2016-01-27 NOTE — Patient Instructions (Signed)
Please continue all other medications as before, and refills have been done if requested.  Please have the pharmacy call with any other refills you may need.  Please continue your efforts at being more active, low cholesterol diet, and weight control.  You are otherwise up to date with prevention measures today.  Please keep your appointments with your specialists as you may have planned  Please call if you change your mind about the referrals and tests

## 2016-01-27 NOTE — Progress Notes (Signed)
Pre visit review using our clinic review tool, if applicable. No additional management support is needed unless otherwise documented below in the visit note. 

## 2016-01-29 ENCOUNTER — Telehealth: Payer: Self-pay | Admitting: Internal Medicine

## 2016-01-29 ENCOUNTER — Encounter: Payer: Self-pay | Admitting: Internal Medicine

## 2016-01-29 NOTE — Telephone Encounter (Signed)
FYI- this is the response I got from the patient   and this is probably why my friend died in 06-16-2022 this year because the cancer was not found early enough. Thanks  ----- Message -----  From: [employer]ito [employer]  Sent: 01/26/2016 3:30 PM EST  To: Marco Collie  Subject: follow up for prostate exam  Good afternoon,   I wanted to follow up and advise what Dr. Jenny Reichmann told me about the prostate exam requested. To quote the following- " No prostate exam needed since this is not recommended by the [place] [employer] for [employer] for the past 8 yrs (was recommended only prior to that)".    We will see you tomorrow for your appointment at 9:30 AM

## 2016-01-31 ENCOUNTER — Ambulatory Visit
Admission: RE | Admit: 2016-01-31 | Discharge: 2016-01-31 | Disposition: A | Discharge status: Home / Self Care | Payer: Medicare Other | Source: Ambulatory Visit | Attending: Internal Medicine | Admitting: Internal Medicine

## 2016-01-31 DIAGNOSIS — R413 Other amnesia: Secondary | ICD-10-CM

## 2016-01-31 DIAGNOSIS — S0990XA Unspecified injury of head, initial encounter: Secondary | ICD-10-CM

## 2016-01-31 NOTE — Assessment & Plan Note (Signed)
Stable, reassured, for leg elevation, support stockings

## 2016-01-31 NOTE — Assessment & Plan Note (Signed)
Declines f/u spep

## 2016-01-31 NOTE — Assessment & Plan Note (Signed)
Declines urology referal,  to f/u any worsening symptoms or concerns

## 2016-01-31 NOTE — Progress Notes (Signed)
Subjective:    Patient ID: Raymond Dean, male    DOB: 10/31/1933, 82 y.o.   MRN: [id number]  HPI  Here to f/u, Pt denies chest pain, increased sob or doe, wheezing, orthopnea, PND, increased LE swelling, palpitations, dizziness or syncope.  Pt denies new neurological symptoms such as new headache, or facial or extremity weakness or numbness   Pt denies polydipsia, polyuria.  Has no hx of parasite Denies urinary symptoms such as dysuria, frequency, urgency, flank pain, hematuria or n/v, fever, chills.  Past Medical History:  Diagnosis Date  . ANEMIA-NOS 03/14/2007   Qualifier: Diagnosis of  By: Jenny Reichmann MD, Hunt Oris   . BENIGN PROSTATIC HYPERTROPHY 11/02/2006   Qualifier: Diagnosis of  By: Jenny Reichmann MD, Hunt Oris   . 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 DIABETES MELLITUS, TYPE II 02/01/2007   Qualifier: Diagnosis of  By: Jenny Reichmann MD, Hunt Oris   . DIVERTICULOSIS, COLON 02/01/2007   Qualifier: Diagnosis of  By: Jenny Reichmann MD, Hunt Oris   . Eosinophilia 11/05/2009   Qualifier: Diagnosis of  By: Jenny Reichmann MD, Hunt Oris   . GLAUCOMA 11/05/2009   Qualifier: Diagnosis of  By: Jenny Reichmann MD, Hunt Oris   . HYPERLIPIDEMIA 11/02/2006   Qualifier: Diagnosis of  By: Jenny Reichmann MD, Hunt Oris   . HYPERTENSION 09/12/2006   Qualifier: Diagnosis of  By: Elveria Royals   . RENAL INSUFFICIENCY 03/14/2007   Qualifier: Diagnosis of  By: Jenny Reichmann MD, Hunt Oris    No past surgical history on file.  reports that he has never smoked. He has never used smokeless tobacco. He reports that he drinks alcohol. He reports that he does not use drugs. family history includes Diabetes in his mother; Hypertension in his father and sister; Stroke in his brother and mother. Allergies  Allergen Reactions  . Ace Inhibitors    Current Outpatient Prescriptions on File Prior to Visit  Medication Sig Dispense Refill  . amiloride-hydrochlorothiazide (MODURETIC) 5-50 MG tablet 1/2 tab by mouth per day 45 tablet 3  . amLODipine (NORVASC) 10 MG tablet Take 1 tablet  (10 mg total) by mouth daily. 90 tablet 3  . aspirin EC 81 MG tablet Take 1 tablet (81 mg total) by mouth daily. 90 tablet 11  . atorvastatin (LIPITOR) 10 MG tablet Take 1 tablet (10 mg total) by mouth daily. 90 tablet 3  . AURORA LANCET SUPER THIN 30G MISC Use as directed twice daily to check blood sugar.  Diagnosis code E11.9 200 each 11  . betamethasone valerate (VALISONE) 0.1 % cream Apply topically 2 (two) times daily. 45 g 2  . glucose blood (ONE TOUCH ULTRA TEST) test strip Use as directed once daily to check blood sugar.  Diagnosis code 250.00 100 each 11  . latanoprost (XALATAN) 0.005 % ophthalmic solution Place 1 drop into both eyes at bedtime.    . metFORMIN (GLUCOPHAGE) 500 MG tablet Take 1 tablet (500 mg total) by mouth 3 (three) times daily. 270 tablet 3  . traMADol (ULTRAM) 50 MG tablet 1 tabs by mouth up to 4 times per day 120 tablet 5   No current facility-administered medications on file prior to visit.    Review of Systems  Constitutional: Negative for unusual diaphoresis or night sweats HENT: Negative for ear swelling or discharge Eyes: Negative for worsening visual haziness  Respiratory: Negative for choking and stridor.   Gastrointestinal: Negative for distension or worsening eructation Genitourinary: Negative for retention or change in urine volume.  Musculoskeletal:  Negative for other MSK pain or swelling Skin: Negative for color change and worsening wound Neurological: Negative for tremors and numbness other than noted  Psychiatric/Behavioral: Negative for decreased concentration or agitation other than above   All other system neg per pt    Objective:   Physical Exam BP 138/78   Pulse 66   Temp 98.1 F (36.7 C) (Oral)   Resp 20   Wt 174 lb (78.9 kg)   SpO2 97%   BMI 24.97 kg/m  VS noted,  Constitutional: Pt appears in no apparent distress HENT: Head: NCAT.  Right Ear: External ear normal.  Left Ear: External ear normal.  Eyes: . Pupils are equal,  round, and reactive to light. Conjunctivae and EOM are normal Neck: Normal range of motion. Neck supple.  Cardiovascular: Normal rate and regular rhythm.   Pulmonary/Chest: Effort normal and breath sounds without rales or wheezing.  Neurological: Pt is alert. Not confused , motor grossly intact Skin: Skin is warm. No rash, trace bilat pedal edema Psychiatric: Pt behavior is normal. No agitation.  No other exam changes    Assessment & Plan:

## 2016-01-31 NOTE — Assessment & Plan Note (Signed)
Declines further eval such as referral hematology

## 2016-02-01 ENCOUNTER — Telehealth: Payer: Self-pay | Admitting: Internal Medicine

## 2016-02-01 NOTE — Telephone Encounter (Signed)
Patient states that tramadol did not make it to the pharmacy.  Is asking for this to be resent.

## 2016-02-02 NOTE — Telephone Encounter (Signed)
This has been faxed.

## 2016-02-04 ENCOUNTER — Other Ambulatory Visit: Payer: Self-pay | Admitting: General Practice

## 2016-02-19 ENCOUNTER — Telehealth: Payer: Self-pay | Admitting: Internal Medicine

## 2016-02-19 NOTE — Telephone Encounter (Signed)
Patient dismissed from [facility] by Cathlean Cower MD , effective January 29, 2016. Dismissal letter sent out by certified / registered mail.  DAJ

## 2016-02-29 NOTE — Telephone Encounter (Signed)
Received signed domestic return receipt verifying delivery of certified letter on February 25, 2016. Article number [id number] [place]

## 2017-04-14 IMAGING — MR MR HEAD W/O CM
9 of 10 series · 37 of 48 positions shown · non-contrast
Comparison: None.

CLINICAL DATA: Fall 5 weeks ago head injury.  Memory dysfunction.

EXAM:
MRI HEAD WITHOUT CONTRAST
TECHNIQUE: Multiplanar, multiecho pulse sequences of the brain and surrounding
structures were obtained without intravenous contrast.

[Series 2: T1 · sagittal · 5.0mm · 0.45mm/px · 2 of 19 slices shown]
[im 1/19]
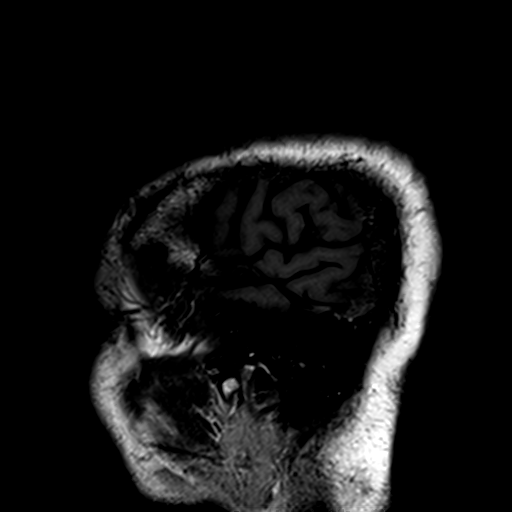
[im 19/19]
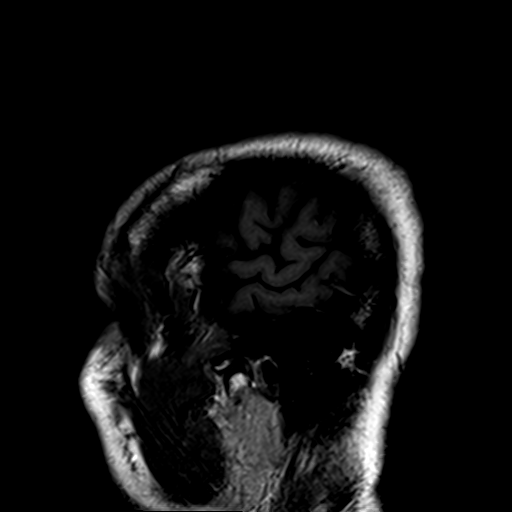

[Series 3: T2 · axial · 5.0mm · 0.45mm/px · z∈[-57,+79]mm · 2 of 22 slices shown (1 of 2)]
[im 1/22]
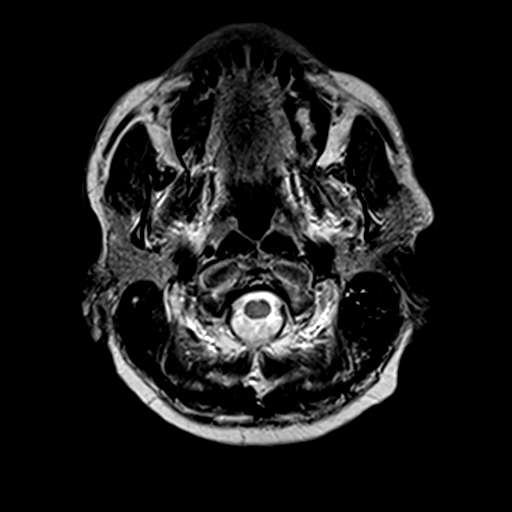
[im 22/22]
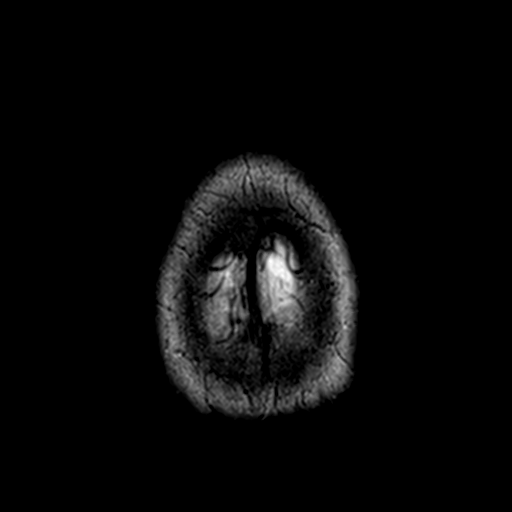

[Series 4: DWI · axial · 3.0mm · 0.94mm/px · z∈[-59,+82]mm · 8 of 96 slices shown (1 of 2)]
[im 1/96]
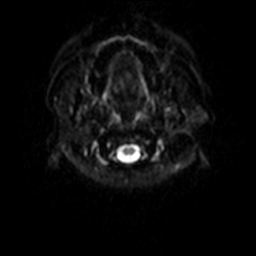
[im 11/96]
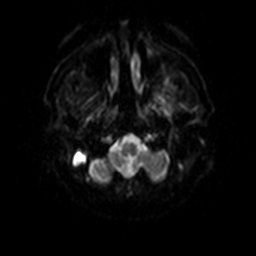
[im 32/96]
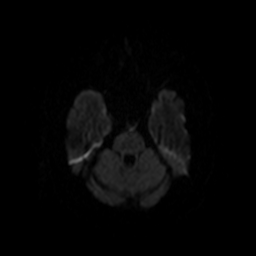
[im 43/96]
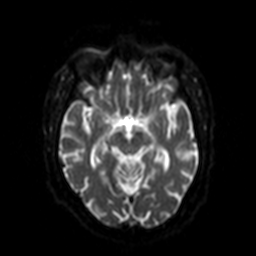
[im 53/96]
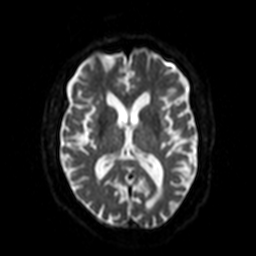
[im 64/96]
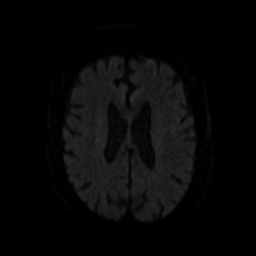
[im 85/96]
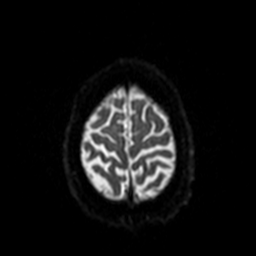
[im 96/96]
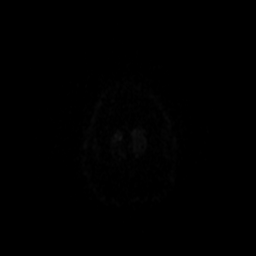

[Series 5: dwi_adc · axial · 3.0mm · 0.94mm/px · z∈[-59,+82]mm · 5 of 48 slices shown]
[im 1/48]
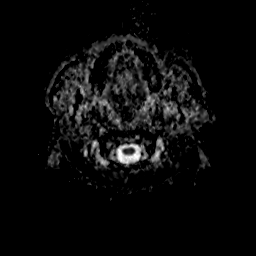
[im 12/48]
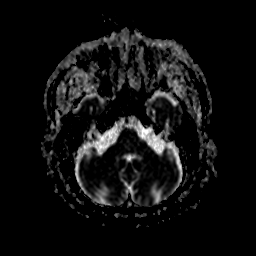
[im 24/48]
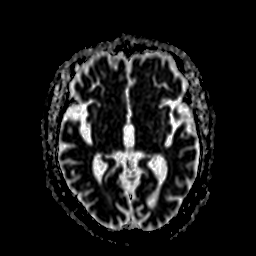
[im 36/48]
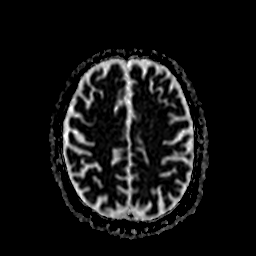
[im 48/48]
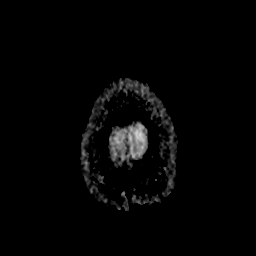

[Series 6: DWI · coronal · 5.0mm · 0.90mm/px · 7 of 66 slices shown (2 of 2)]
[im 1/66]
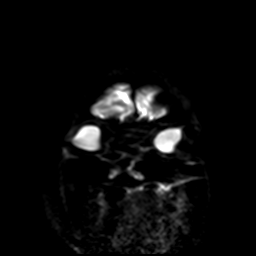
[im 11/66]
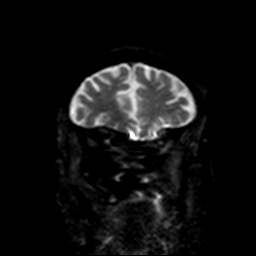
[im 22/66]
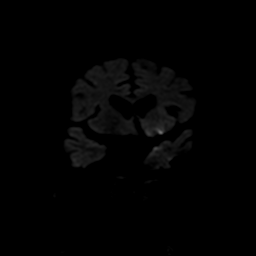
[im 33/66]
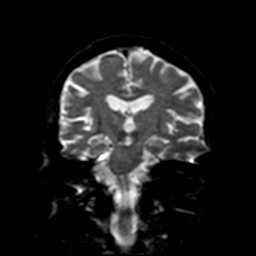
[im 44/66]
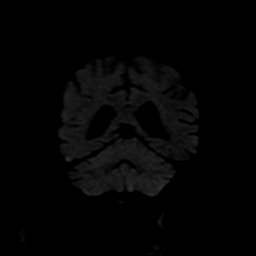
[im 55/66]
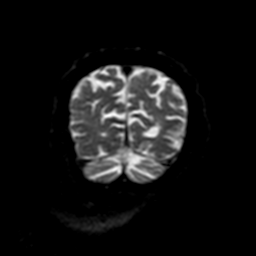
[im 66/66]
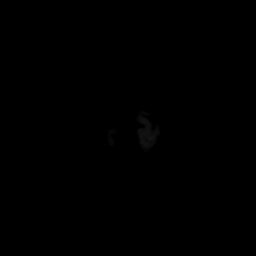

[Series 7: cor dwi_adc · coronal · 5.0mm · 0.90mm/px · 3 of 33 slices shown]
[im 1/33]
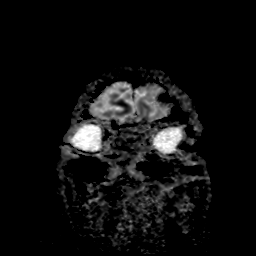
[im 17/33]
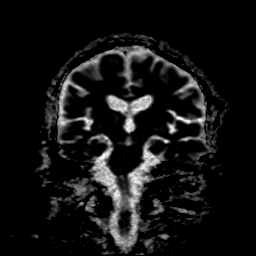
[im 33/33]
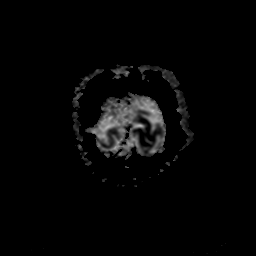

[Series 9: axial (person_name)1 volume · axial · 2.0mm · 0.45mm/px · z∈[-60,+34]mm · 5 of 72 slices shown]
[im 1/72]
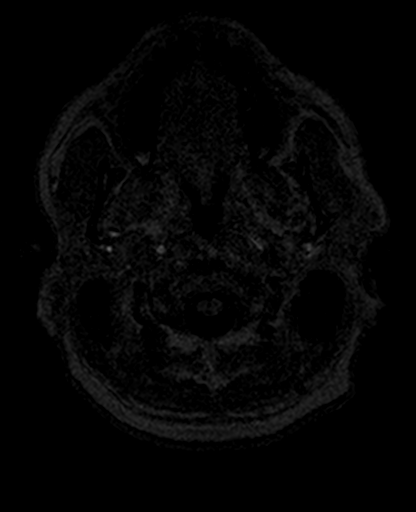
[im 12/72]
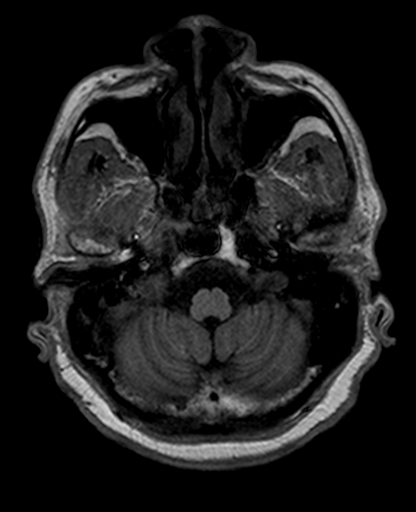
[im 24/72]
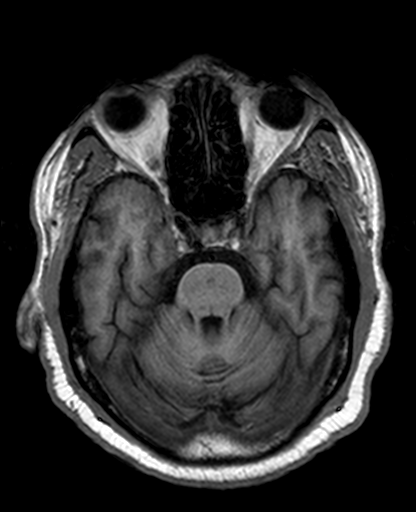
[im 36/72]
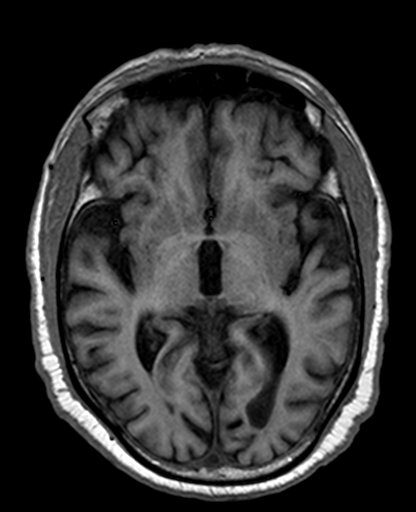
[im 48/72]
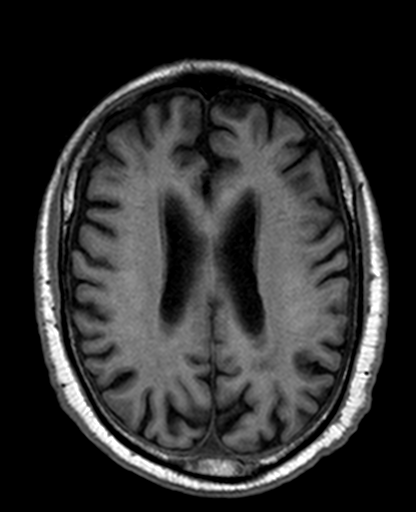

[Series 10: FLAIR · axial · 5.0mm · 0.45mm/px · z∈[-57,+79]mm · 2 of 22 slices shown]
[im 1/22]
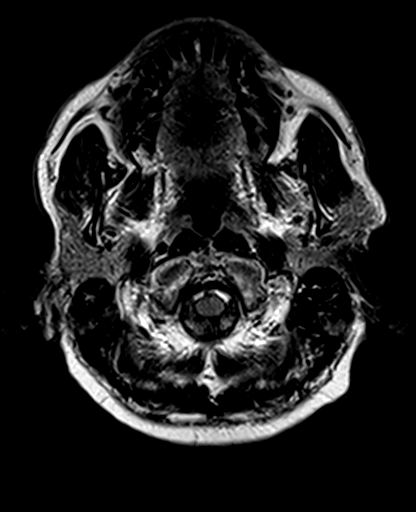
[im 22/22]
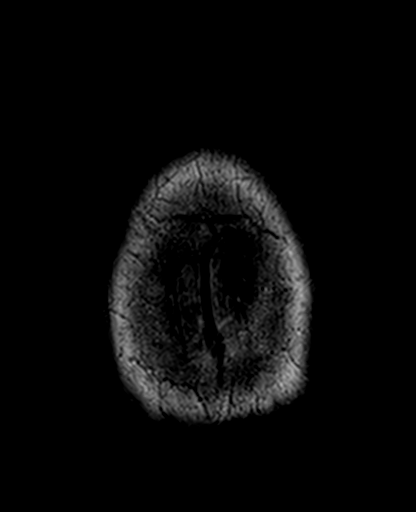

[Series 13: T2 · coronal · 5.0mm · 0.45mm/px · 3 of 25 slices shown (2 of 2)]
[im 1/25]
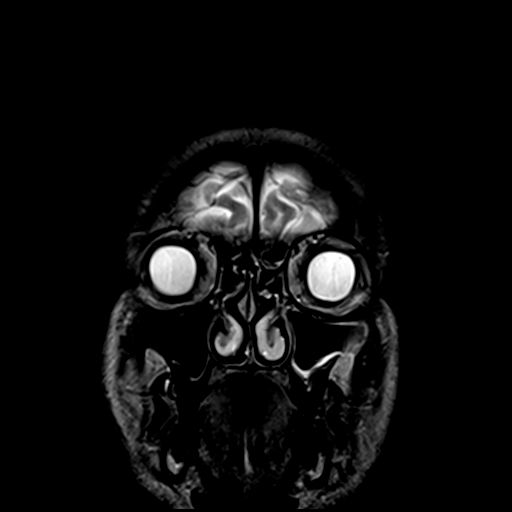
[im 13/25]
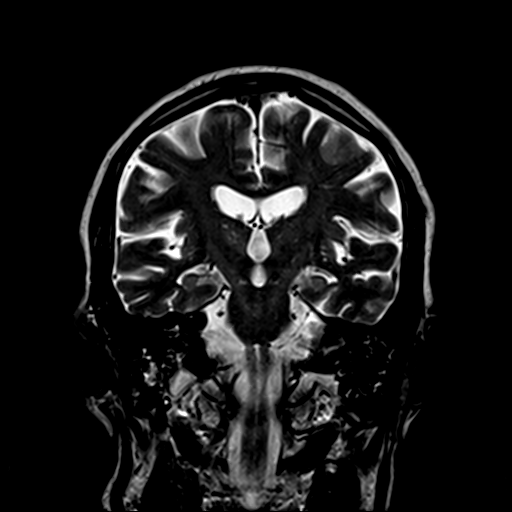
[im 25/25]
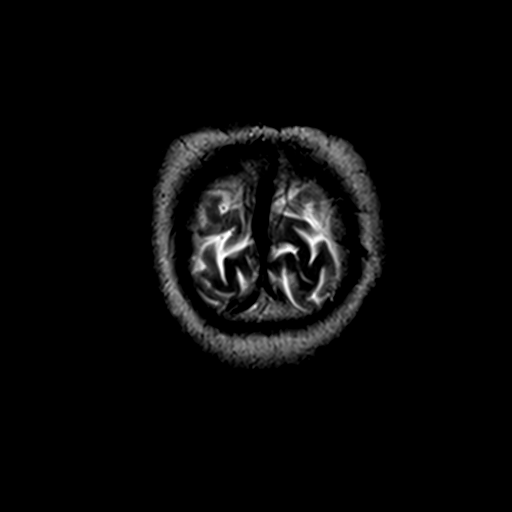

[37 of 48 positions shown; findings below may reference images not displayed]

FINDINGS: Brain: Mild atrophy for age. Negative for acute infarct. Mild
chronic microvascular ischemic changes in the white matter. Mild
chronic ischemia in the pons.

Negative for hemorrhage or fluid collection. Negative for mass or
edema. No shift of the midline structures. Pituitary normal in size.

Vascular: Normal arterial flow voids.

Skull and upper cervical spine: Negative

Sinuses/Orbits: Mild mucosal edema in the paranasal sinuses.
Bilateral mastoid effusion.

Other: None
IMPRESSION: Age-appropriate atrophy. Mild chronic microvascular ischemia. No
acute infarct. Negative for subdural fluid collection

Mucosal edema in the paranasal sinuses and in the mastoid sinus
bilaterally.

## 2017-07-24 ENCOUNTER — Other Ambulatory Visit: Payer: Self-pay | Admitting: Internal Medicine

## 2018-09-24 ENCOUNTER — Other Ambulatory Visit: Payer: Self-pay

## 2021-02-21 DEATH — deceased
